# Patient Record
Sex: Female | Born: 1980 | Race: Black or African American | Hispanic: No | Marital: Single | Smoking: Never smoker
Health system: Southern US, Community
[De-identification: ages and names within clinical notes are randomized; demographics above are authoritative.]

## PROBLEM LIST (undated history)

## (undated) ENCOUNTER — Inpatient Hospital Stay (HOSPITAL_COMMUNITY): Payer: Self-pay

## (undated) DIAGNOSIS — O24419 Gestational diabetes mellitus in pregnancy, unspecified control: Secondary | ICD-10-CM

## (undated) DIAGNOSIS — R112 Nausea with vomiting, unspecified: Secondary | ICD-10-CM

## (undated) DIAGNOSIS — N883 Incompetence of cervix uteri: Secondary | ICD-10-CM

## (undated) DIAGNOSIS — Z6841 Body Mass Index (BMI) 40.0 and over, adult: Secondary | ICD-10-CM

## (undated) DIAGNOSIS — I10 Essential (primary) hypertension: Secondary | ICD-10-CM

## (undated) DIAGNOSIS — Z9889 Other specified postprocedural states: Secondary | ICD-10-CM

## (undated) DIAGNOSIS — Z8632 Personal history of gestational diabetes: Secondary | ICD-10-CM

## (undated) HISTORY — DX: Essential (primary) hypertension: I10

## (undated) HISTORY — DX: Morbid (severe) obesity due to excess calories: E66.01

## (undated) HISTORY — DX: Incompetence of cervix uteri: N88.3

## (undated) HISTORY — DX: Body Mass Index (BMI) 40.0 and over, adult: Z684

## (undated) HISTORY — PX: WISDOM TOOTH EXTRACTION: SHX21

## (undated) HISTORY — DX: Personal history of gestational diabetes: Z86.32

## (undated) HISTORY — PX: DILATION AND CURETTAGE OF UTERUS: SHX78

## (undated) HISTORY — DX: Gestational diabetes mellitus in pregnancy, unspecified control: O24.419

---

## 2003-12-30 ENCOUNTER — Inpatient Hospital Stay (HOSPITAL_COMMUNITY): Admission: AD | Admit: 2003-12-30 | Discharge: 2003-12-30 | Payer: Self-pay | Admitting: Obstetrics & Gynecology

## 2004-02-01 ENCOUNTER — Inpatient Hospital Stay (HOSPITAL_COMMUNITY): Admission: AD | Admit: 2004-02-01 | Discharge: 2004-02-02 | Payer: Self-pay | Admitting: Obstetrics and Gynecology

## 2004-03-22 ENCOUNTER — Inpatient Hospital Stay (HOSPITAL_COMMUNITY): Admission: AD | Admit: 2004-03-22 | Discharge: 2004-03-31 | Payer: Self-pay | Admitting: *Deleted

## 2004-03-22 IMAGING — US US OB COMP +14 WK
1 series · 13 of 28 positions shown · non-contrast
Comparison: none

CLINICAL DATA: Evaluate anatomy.

[Series 1: unknown · 0.35mm/px · 13 of 88 slices shown]
[im 4/88]
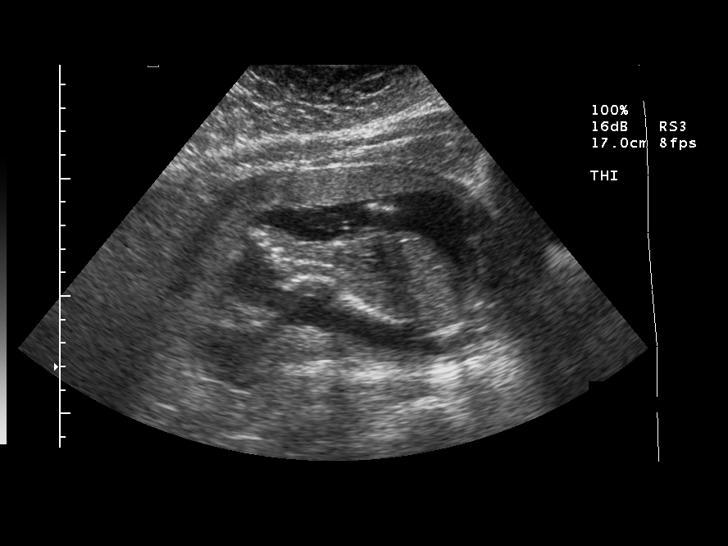
[im 10/88]
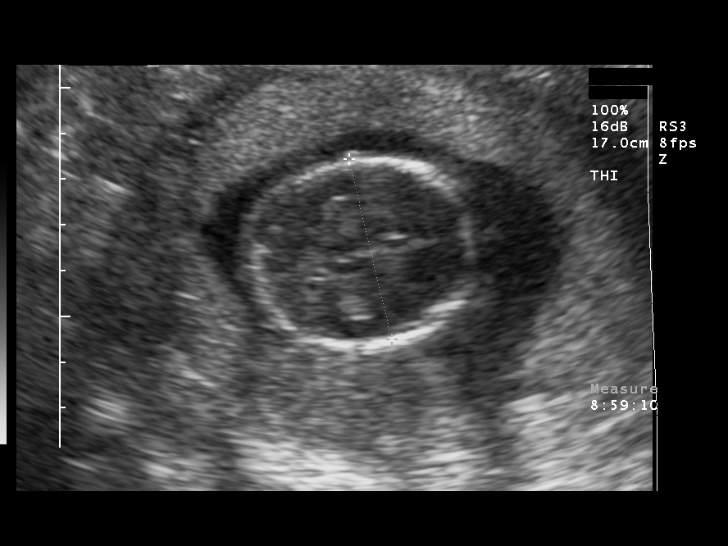
[im 17/88]
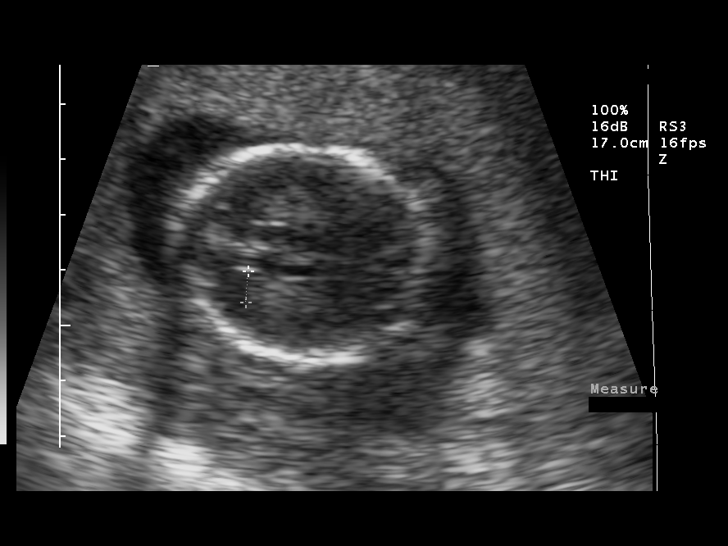
[im 23/88]
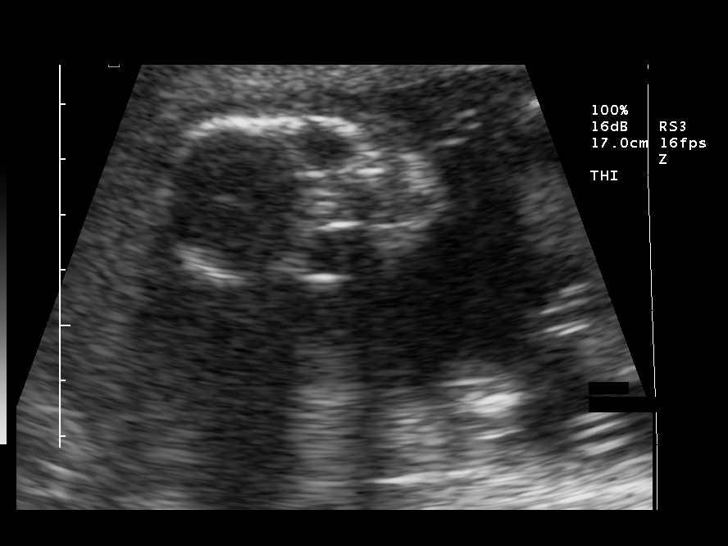
[im 30/88]
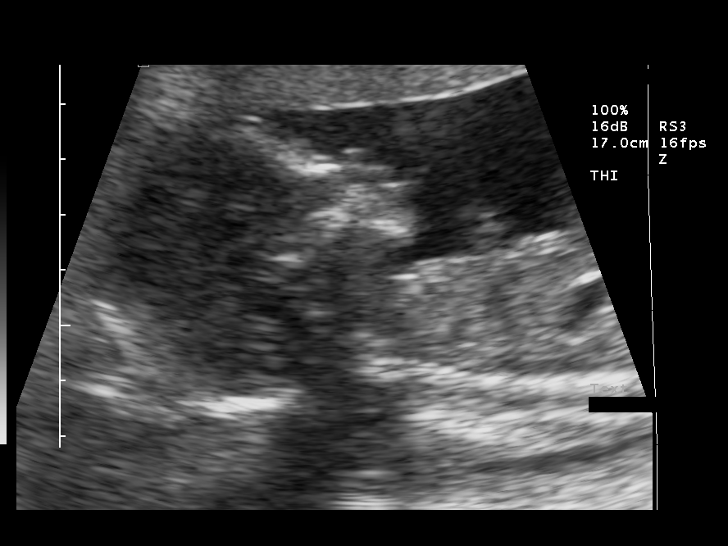
[im 36/88]
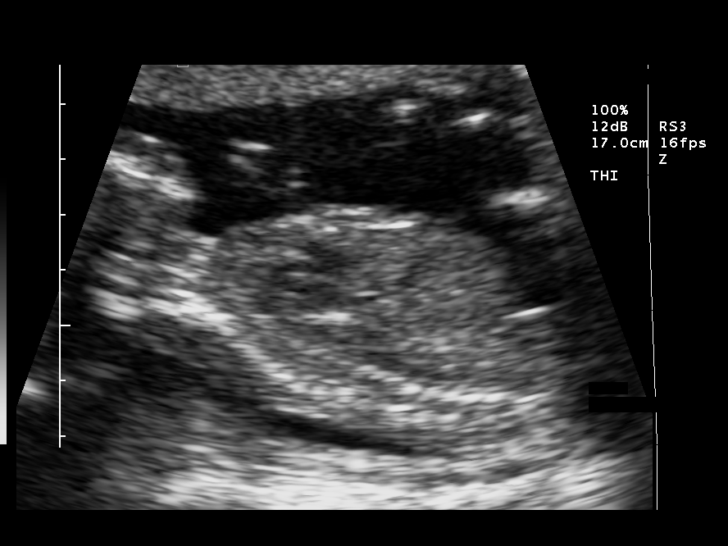
[im 46/88]
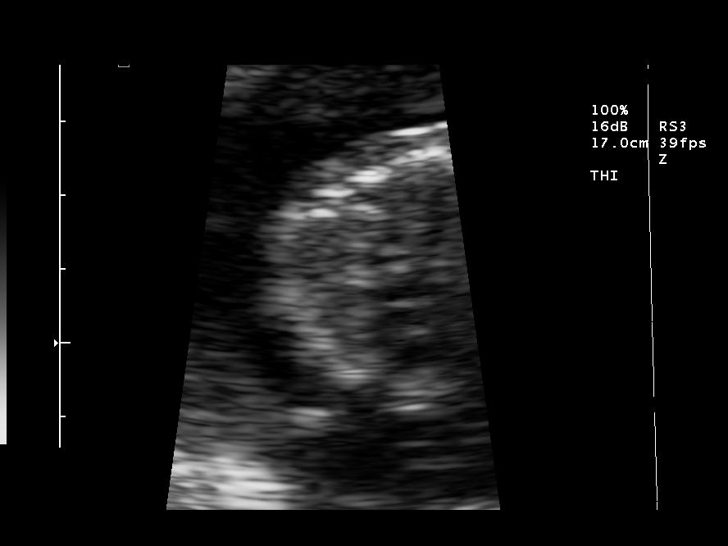
[im 52/88]
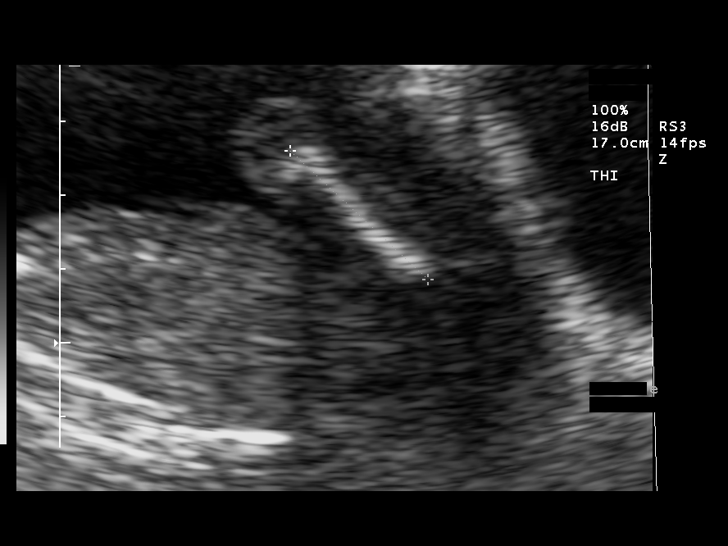
[im 59/88]
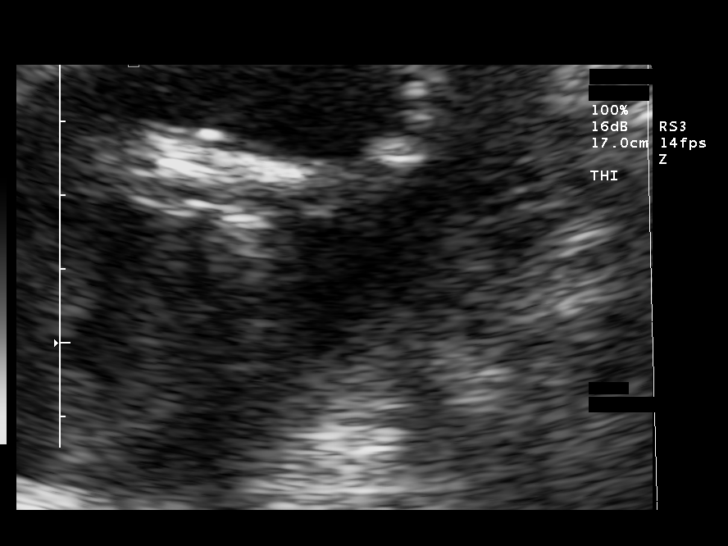
[im 65/88]
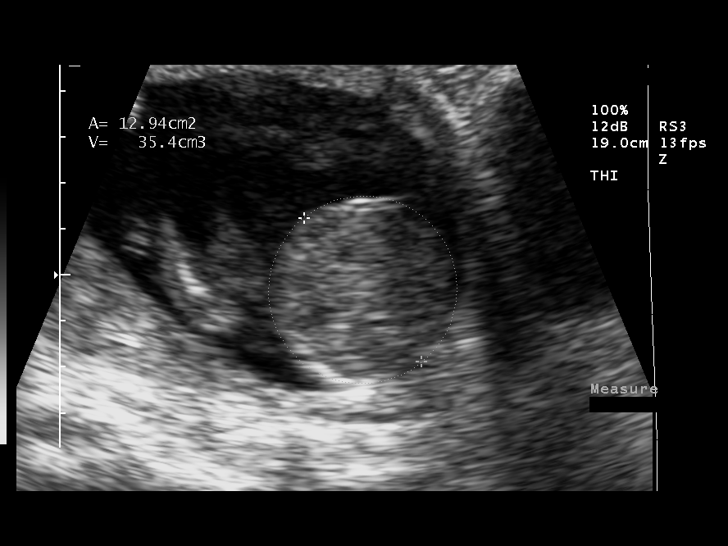
[im 71/88]
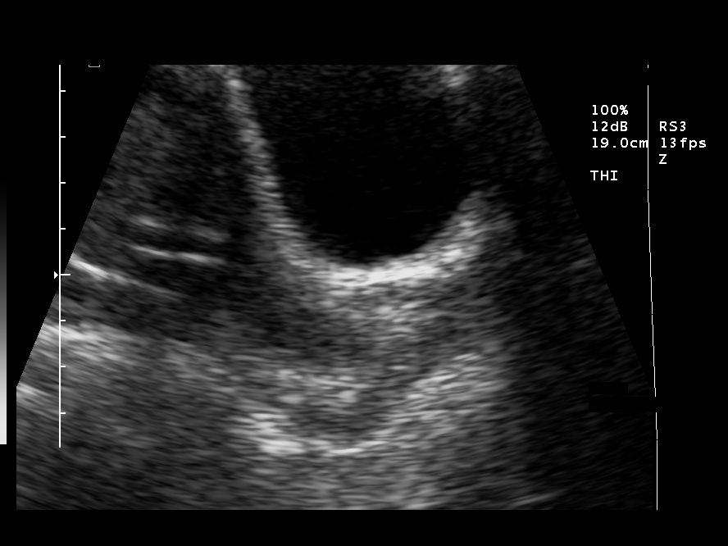
[im 78/88]
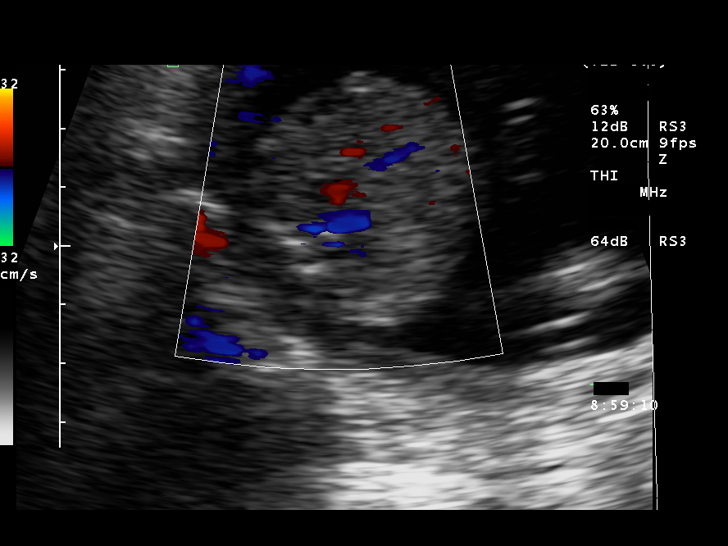
[im 84/88]
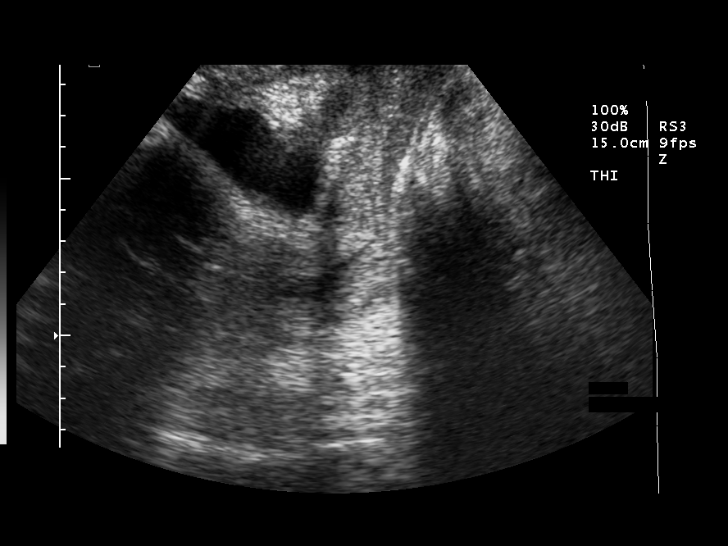

[13 of 28 positions shown; findings below may reference images not displayed]

OBSTETRICAL ULTRASOUND WITH TRANSVAGINAL:
 Number of Fetuses: 1
 Heart Rate:  168
 Movement:  Yes
 Breathing:  No  
 Presentation:  Breech
 Placental Location:  Anterior
 Grade:  I
 Previa:  No
 Amniotic Fluid (Subjective):  Normal
 Amniotic Fluid (Objective):   4.2 cm Vertical pocket 

 FETAL BIOMETRY
 BPD:   4.0 cm   18 w 1 d
 HC:   15.2 cm   18 w 2 d
 AC:   13.0 cm   18 w 4 d
 FL:    3.0 cm   19 w 1 d

 MEAN GA:  18 w 4 d

 FETAL ANATOMY
 Lateral Ventricles:    Visualized 
 Thalami/CSP:      Visualized 
 Posterior Fossa:  Visualized 
 Nuchal Region:    Visualized 
 Spine:      Limited
 4 Chamber Heart on Left:      Not visualized   
 Stomach on Left:      Visualized 
 3 Vessel Cord:    Visualized 
 Cord Insertion site:    Visualized 
 Kidneys:  Visualized 
 Bladder:  Visualized 
 Extremities:      Visualized 

 ADDITIONAL ANATOMY VISUALIZED:  RVOT, upper lip, orbits, profile, diaphragm, heel, 5th digit, ductal arch, aortic arch, and female genitalia

 Evaluation limited by:  Maternal habitus

 MATERNAL FINDINGS
 Cervix:   1.8 cm Transvaginally
IMPRESSION: Single living intrauterine fetus in breech presentation with subjectively normal amniotic fluid volume.  Mean gestational age today by ultrasound is 18 weeks 4 days which correlates well with the reported gestational age by LMP.  
 Visualized fetal anatomy is unremarkable although four chamber view of the heart and complete imaging of the spine was not obtained secondary to maternal habitus.  
 Dynamic fundus measuring as short as 1.8 cm transvaginally.  This report was called to [REDACTED] by the sonographer prior to the patient leaving the [HOSPITAL].

 </u12:p>

## 2004-04-03 ENCOUNTER — Encounter: Admission: RE | Admit: 2004-04-03 | Discharge: 2004-04-03 | Payer: Self-pay | Admitting: Obstetrics & Gynecology

## 2004-04-05 ENCOUNTER — Encounter: Admission: RE | Admit: 2004-04-05 | Discharge: 2004-04-05 | Payer: Self-pay | Admitting: *Deleted

## 2004-04-12 ENCOUNTER — Encounter: Admission: RE | Admit: 2004-04-12 | Discharge: 2004-04-12 | Payer: Self-pay | Admitting: *Deleted

## 2004-04-18 ENCOUNTER — Inpatient Hospital Stay (HOSPITAL_COMMUNITY): Admission: AD | Admit: 2004-04-18 | Discharge: 2004-04-22 | Payer: Self-pay | Admitting: *Deleted

## 2004-04-21 ENCOUNTER — Encounter (INDEPENDENT_AMBULATORY_CARE_PROVIDER_SITE_OTHER): Payer: Self-pay | Admitting: Specialist

## 2005-02-27 ENCOUNTER — Ambulatory Visit (HOSPITAL_COMMUNITY): Admission: RE | Admit: 2005-02-27 | Discharge: 2005-02-27 | Payer: Self-pay | Admitting: *Deleted

## 2005-03-14 ENCOUNTER — Ambulatory Visit: Payer: Self-pay | Admitting: *Deleted

## 2005-03-15 ENCOUNTER — Inpatient Hospital Stay (HOSPITAL_COMMUNITY): Admission: AD | Admit: 2005-03-15 | Discharge: 2005-03-17 | Payer: Self-pay | Admitting: Obstetrics and Gynecology

## 2005-03-15 ENCOUNTER — Ambulatory Visit: Payer: Self-pay | Admitting: Obstetrics and Gynecology

## 2005-03-21 ENCOUNTER — Ambulatory Visit: Payer: Self-pay | Admitting: Family Medicine

## 2005-03-22 ENCOUNTER — Ambulatory Visit: Payer: Self-pay | Admitting: *Deleted

## 2005-03-23 ENCOUNTER — Inpatient Hospital Stay (HOSPITAL_COMMUNITY): Admission: AD | Admit: 2005-03-23 | Discharge: 2005-03-23 | Payer: Self-pay | Admitting: Family Medicine

## 2005-04-03 ENCOUNTER — Ambulatory Visit: Payer: Self-pay | Admitting: *Deleted

## 2005-04-10 ENCOUNTER — Ambulatory Visit: Payer: Self-pay | Admitting: *Deleted

## 2005-04-24 ENCOUNTER — Ambulatory Visit: Payer: Self-pay | Admitting: Obstetrics & Gynecology

## 2005-05-01 ENCOUNTER — Ambulatory Visit (HOSPITAL_COMMUNITY): Admission: RE | Admit: 2005-05-01 | Discharge: 2005-05-01 | Payer: Self-pay | Admitting: *Deleted

## 2005-05-08 ENCOUNTER — Ambulatory Visit: Payer: Self-pay | Admitting: Obstetrics & Gynecology

## 2005-05-22 ENCOUNTER — Ambulatory Visit (HOSPITAL_COMMUNITY): Admission: RE | Admit: 2005-05-22 | Discharge: 2005-05-22 | Payer: Self-pay | Admitting: *Deleted

## 2005-05-22 ENCOUNTER — Ambulatory Visit: Payer: Self-pay | Admitting: *Deleted

## 2005-05-29 ENCOUNTER — Ambulatory Visit: Payer: Self-pay | Admitting: Obstetrics & Gynecology

## 2005-06-05 ENCOUNTER — Ambulatory Visit: Payer: Self-pay | Admitting: *Deleted

## 2005-06-12 ENCOUNTER — Ambulatory Visit: Payer: Self-pay | Admitting: Obstetrics & Gynecology

## 2005-06-19 ENCOUNTER — Ambulatory Visit: Payer: Self-pay | Admitting: Obstetrics & Gynecology

## 2005-06-26 ENCOUNTER — Ambulatory Visit: Payer: Self-pay | Admitting: Family Medicine

## 2005-07-03 ENCOUNTER — Ambulatory Visit: Payer: Self-pay | Admitting: Obstetrics & Gynecology

## 2005-07-10 ENCOUNTER — Ambulatory Visit: Payer: Self-pay | Admitting: Obstetrics & Gynecology

## 2005-07-17 ENCOUNTER — Ambulatory Visit: Payer: Self-pay | Admitting: *Deleted

## 2005-07-17 ENCOUNTER — Ambulatory Visit (HOSPITAL_COMMUNITY): Admission: RE | Admit: 2005-07-17 | Discharge: 2005-07-17 | Payer: Self-pay | Admitting: *Deleted

## 2005-07-24 ENCOUNTER — Inpatient Hospital Stay (HOSPITAL_COMMUNITY): Admission: AD | Admit: 2005-07-24 | Discharge: 2005-07-25 | Payer: Self-pay | Admitting: Obstetrics and Gynecology

## 2005-07-24 ENCOUNTER — Ambulatory Visit: Payer: Self-pay | Admitting: *Deleted

## 2005-07-24 ENCOUNTER — Ambulatory Visit: Payer: Self-pay | Admitting: Obstetrics and Gynecology

## 2005-07-29 ENCOUNTER — Ambulatory Visit: Payer: Self-pay | Admitting: *Deleted

## 2005-07-29 ENCOUNTER — Ambulatory Visit: Payer: Self-pay | Admitting: Obstetrics and Gynecology

## 2005-07-29 ENCOUNTER — Inpatient Hospital Stay (HOSPITAL_COMMUNITY): Admission: RE | Admit: 2005-07-29 | Discharge: 2005-07-29 | Payer: Self-pay | Admitting: *Deleted

## 2005-07-31 ENCOUNTER — Ambulatory Visit: Payer: Self-pay | Admitting: Obstetrics & Gynecology

## 2005-08-05 ENCOUNTER — Ambulatory Visit (HOSPITAL_COMMUNITY): Admission: RE | Admit: 2005-08-05 | Discharge: 2005-08-05 | Payer: Self-pay | Admitting: *Deleted

## 2005-08-05 ENCOUNTER — Ambulatory Visit: Payer: Self-pay | Admitting: Obstetrics and Gynecology

## 2005-08-07 ENCOUNTER — Ambulatory Visit: Payer: Self-pay | Admitting: Family Medicine

## 2005-08-09 ENCOUNTER — Inpatient Hospital Stay (HOSPITAL_COMMUNITY): Admission: AD | Admit: 2005-08-09 | Discharge: 2005-08-22 | Payer: Self-pay | Admitting: *Deleted

## 2005-08-09 ENCOUNTER — Ambulatory Visit: Payer: Self-pay | Admitting: *Deleted

## 2005-08-09 ENCOUNTER — Ambulatory Visit: Payer: Self-pay | Admitting: Family Medicine

## 2005-08-11 ENCOUNTER — Ambulatory Visit: Payer: Self-pay | Admitting: Neonatology

## 2005-08-19 ENCOUNTER — Encounter (INDEPENDENT_AMBULATORY_CARE_PROVIDER_SITE_OTHER): Payer: Self-pay | Admitting: Specialist

## 2005-08-24 ENCOUNTER — Inpatient Hospital Stay (HOSPITAL_COMMUNITY): Admission: AD | Admit: 2005-08-24 | Discharge: 2005-08-24 | Payer: Self-pay | Admitting: Obstetrics & Gynecology

## 2005-09-05 ENCOUNTER — Ambulatory Visit: Payer: Self-pay | Admitting: Family Medicine

## 2005-10-04 ENCOUNTER — Ambulatory Visit: Payer: Self-pay | Admitting: Family Medicine

## 2006-03-14 ENCOUNTER — Ambulatory Visit: Payer: Self-pay | Admitting: Family Medicine

## 2006-04-11 ENCOUNTER — Ambulatory Visit: Payer: Self-pay | Admitting: Family Medicine

## 2006-06-27 ENCOUNTER — Ambulatory Visit: Payer: Self-pay | Admitting: Family Medicine

## 2006-11-27 DIAGNOSIS — L659 Nonscarring hair loss, unspecified: Secondary | ICD-10-CM | POA: Insufficient documentation

## 2006-11-27 DIAGNOSIS — I10 Essential (primary) hypertension: Secondary | ICD-10-CM

## 2006-11-27 DIAGNOSIS — E669 Obesity, unspecified: Secondary | ICD-10-CM

## 2007-04-23 ENCOUNTER — Ambulatory Visit: Payer: Self-pay | Admitting: Family Medicine

## 2007-04-24 ENCOUNTER — Ambulatory Visit (HOSPITAL_COMMUNITY): Admission: RE | Admit: 2007-04-24 | Discharge: 2007-04-24 | Payer: Self-pay | Admitting: Obstetrics and Gynecology

## 2007-04-29 ENCOUNTER — Ambulatory Visit: Payer: Self-pay | Admitting: Gynecology

## 2007-05-07 ENCOUNTER — Ambulatory Visit: Payer: Self-pay | Admitting: Family Medicine

## 2007-05-28 ENCOUNTER — Ambulatory Visit: Payer: Self-pay | Admitting: *Deleted

## 2007-06-02 ENCOUNTER — Ambulatory Visit (HOSPITAL_COMMUNITY): Admission: RE | Admit: 2007-06-02 | Discharge: 2007-06-02 | Payer: Self-pay | Admitting: Family Medicine

## 2007-06-03 ENCOUNTER — Ambulatory Visit (HOSPITAL_COMMUNITY): Admission: RE | Admit: 2007-06-03 | Discharge: 2007-06-03 | Payer: Self-pay | Admitting: Family Medicine

## 2007-06-03 ENCOUNTER — Ambulatory Visit: Payer: Self-pay | Admitting: Family Medicine

## 2007-06-11 ENCOUNTER — Ambulatory Visit: Payer: Self-pay | Admitting: Family Medicine

## 2007-06-25 ENCOUNTER — Ambulatory Visit: Payer: Self-pay | Admitting: Obstetrics & Gynecology

## 2007-07-14 ENCOUNTER — Ambulatory Visit (HOSPITAL_COMMUNITY): Admission: RE | Admit: 2007-07-14 | Discharge: 2007-07-14 | Payer: Self-pay | Admitting: Obstetrics & Gynecology

## 2007-07-16 ENCOUNTER — Ambulatory Visit: Payer: Self-pay | Admitting: *Deleted

## 2007-07-28 ENCOUNTER — Ambulatory Visit (HOSPITAL_COMMUNITY): Admission: RE | Admit: 2007-07-28 | Discharge: 2007-07-28 | Payer: Self-pay | Admitting: Obstetrics & Gynecology

## 2007-08-06 ENCOUNTER — Ambulatory Visit: Payer: Self-pay | Admitting: Obstetrics & Gynecology

## 2007-08-11 ENCOUNTER — Ambulatory Visit (HOSPITAL_COMMUNITY): Admission: RE | Admit: 2007-08-11 | Discharge: 2007-08-11 | Payer: Self-pay | Admitting: Obstetrics & Gynecology

## 2007-08-13 ENCOUNTER — Ambulatory Visit: Payer: Self-pay | Admitting: Obstetrics & Gynecology

## 2007-08-24 ENCOUNTER — Ambulatory Visit: Payer: Self-pay | Admitting: *Deleted

## 2007-08-24 ENCOUNTER — Ambulatory Visit: Payer: Self-pay | Admitting: Obstetrics & Gynecology

## 2007-08-24 ENCOUNTER — Inpatient Hospital Stay (HOSPITAL_COMMUNITY): Admission: AD | Admit: 2007-08-24 | Discharge: 2007-08-26 | Payer: Self-pay | Admitting: Gynecology

## 2007-08-24 ENCOUNTER — Encounter: Payer: Self-pay | Admitting: *Deleted

## 2007-08-31 ENCOUNTER — Ambulatory Visit: Payer: Self-pay | Admitting: Obstetrics & Gynecology

## 2007-09-08 ENCOUNTER — Ambulatory Visit (HOSPITAL_COMMUNITY): Admission: RE | Admit: 2007-09-08 | Discharge: 2007-09-08 | Payer: Self-pay | Admitting: Obstetrics & Gynecology

## 2007-09-14 ENCOUNTER — Encounter: Admission: RE | Admit: 2007-09-14 | Discharge: 2007-09-14 | Payer: Self-pay | Admitting: Obstetrics & Gynecology

## 2007-09-14 ENCOUNTER — Ambulatory Visit: Payer: Self-pay | Admitting: Obstetrics & Gynecology

## 2007-09-22 ENCOUNTER — Ambulatory Visit (HOSPITAL_COMMUNITY): Admission: RE | Admit: 2007-09-22 | Discharge: 2007-09-22 | Payer: Self-pay | Admitting: Obstetrics & Gynecology

## 2007-09-28 ENCOUNTER — Ambulatory Visit: Payer: Self-pay | Admitting: Obstetrics & Gynecology

## 2007-10-08 ENCOUNTER — Inpatient Hospital Stay (HOSPITAL_COMMUNITY): Admission: AD | Admit: 2007-10-08 | Discharge: 2007-10-08 | Payer: Self-pay | Admitting: Family Medicine

## 2007-10-08 ENCOUNTER — Ambulatory Visit: Payer: Self-pay | Admitting: Physician Assistant

## 2007-10-08 ENCOUNTER — Encounter: Payer: Self-pay | Admitting: *Deleted

## 2007-10-12 ENCOUNTER — Ambulatory Visit: Payer: Self-pay | Admitting: Family Medicine

## 2007-10-12 ENCOUNTER — Ambulatory Visit: Payer: Self-pay | Admitting: Obstetrics and Gynecology

## 2007-10-12 ENCOUNTER — Inpatient Hospital Stay (HOSPITAL_COMMUNITY): Admission: AD | Admit: 2007-10-12 | Discharge: 2007-10-14 | Payer: Self-pay | Admitting: Obstetrics & Gynecology

## 2007-10-16 ENCOUNTER — Ambulatory Visit: Payer: Self-pay | Admitting: Obstetrics and Gynecology

## 2007-10-19 ENCOUNTER — Ambulatory Visit: Payer: Self-pay | Admitting: Family Medicine

## 2007-10-22 ENCOUNTER — Encounter: Payer: Self-pay | Admitting: *Deleted

## 2007-10-22 ENCOUNTER — Inpatient Hospital Stay (HOSPITAL_COMMUNITY): Admission: AD | Admit: 2007-10-22 | Discharge: 2007-10-26 | Payer: Self-pay | Admitting: Obstetrics & Gynecology

## 2007-10-22 ENCOUNTER — Ambulatory Visit: Payer: Self-pay | Admitting: Obstetrics and Gynecology

## 2007-10-23 ENCOUNTER — Encounter: Payer: Self-pay | Admitting: *Deleted

## 2007-10-24 ENCOUNTER — Encounter: Payer: Self-pay | Admitting: Obstetrics and Gynecology

## 2007-11-02 ENCOUNTER — Ambulatory Visit: Payer: Self-pay | Admitting: Gynecology

## 2007-11-04 ENCOUNTER — Ambulatory Visit: Payer: Self-pay | Admitting: Obstetrics and Gynecology

## 2007-12-09 ENCOUNTER — Ambulatory Visit: Payer: Self-pay | Admitting: Obstetrics & Gynecology

## 2007-12-09 ENCOUNTER — Encounter (INDEPENDENT_AMBULATORY_CARE_PROVIDER_SITE_OTHER): Payer: Self-pay | Admitting: Gynecology

## 2007-12-22 ENCOUNTER — Ambulatory Visit: Payer: Self-pay | Admitting: Family Medicine

## 2007-12-31 ENCOUNTER — Ambulatory Visit: Payer: Self-pay | Admitting: Obstetrics & Gynecology

## 2010-10-20 ENCOUNTER — Encounter: Payer: Self-pay | Admitting: *Deleted

## 2010-10-21 ENCOUNTER — Encounter: Payer: Self-pay | Admitting: *Deleted

## 2011-02-12 NOTE — Discharge Summary (Signed)
NAMEAURIANNA, EARLYWINE                 ACCOUNT NO.:  [id number]   MEDICAL RECORD NO.:  [id number]          PATIENT TYPE:  INP   LOCATION:                                 FACILITY:   PHYSICIAN:  Phil D. Okey Dupre, M.D.     DATE OF BIRTH:  [birth year]   DATE OF ADMISSION:  08/24/2007  DATE OF DISCHARGE:  08/26/2007                               DISCHARGE SUMMARY   ADMISSION DIAGNOSES:  1. Intrauterine pregnancy at 24 weeks and three days.  2. Elevated blood pressures.  3. History of pre-eclampsia and hemolysis, elevated liver enzymes and      low platelet count in previous pregnancy.  4. Morbid obesity.  5. History of gestational diabetes.  6. History of pre-term delivery, status post cerclage on June 03, 2007.   DISCHARGE DIAGNOSES:  1. Intrauterine pregnancy at 24 weeks and five days.  2. Gestational hypertension.  3. Gestational diabetes A-2.  4. History of pre-eclampsia and hemolysis, elevated liver enzymes and      low platelet count in previous pregnancy.  5. History of pre-term delivery, status post cerclage.  6. Morbid obesity   PROCEDURE:  The patient had an ultrasound for growth and biophysical  profile performed on August 24, 2007, showing a single gestation  cephalic presentation with an anterior placenta above the cervical os.  Amniotic fluid index was 11.5.  Biophysical profile was 6/8 with fetal  breathing not observed.  The estimated fetal weight was 617 grams, in  the 35th percentile.  Dating was consistent with previous ultrasound.  The cervical length was 4.56 cm without funneling noted.  Recommendation  for followup: Will check for growth and cervical length in two weeks.   CONSULTATIONS:  Maternal fetal medicine, Dr. Ashley Royalty.   COMPLICATIONS:  None.   PERTINENT LABORATORY FINDINGS:  She had admission pH labs showing a  white blood cell count of 8.6, hemoglobin 10.6, hematocrit 30.5,  platelets 395.  Sodium 136, potassium 3.4, BUN 2, creatinine  0.48,  glucose 112.  AST 18, ALT 15.  The remainder of the complete metabolic  profile was normal.  LDH 79, uric acid 3.9.  She had a 24-hour urine  performed that showed protein of 105, creatinine 14.41, creatinine  clearance of 2.8, volume of 2100.  She did have fasting CBG on day three  of her hospital admission of 108.   BRIEF PERTINENT ADMISSION HISTORY:  This is a 30 year old gravida 5,  para 0, 2, 2, 1, with a history of pre-term deliveries at 60 and 36  weeks.  She has a history of HELLP and pre-eclampsia in her 36-week  gestation.  She also has a history of gestational diabetes with a normal  one-hour glucose challenge test this pregnancy.  She was seen in the  [facility] on the morning of admission and had elevated blood  pressures in the 150's/90's.  She was admitted for evaluation for pre-  eclampsia.   HOSPITAL COURSE:  The patient was admitted.  PIH labs were drawn with  values as stated  above, within normal limits.  She had an ultrasound  showing estimated fetal weight in the 35th percentile and a BPP of 6/8.  The remainder of the results of the ultrasound are stated above.  Her  blood pressure ranged from 130's to 160's systolic over 60's to 90's  diastolic.  Maternal fetal medicine was consulted and the recommendation  was made to start antihypertensive, as she continued to have elevated  blood pressures.  Labetalol was started on hospital day number two at  200 mg p.o. twice daily, with good response.  Blood pressures in the  130's over 70's.  She had 24-hour urine performed, as stated above, with  105 mg of protein.  The patient remained asymptomatic throughout her  stay without headache, vision changes or abdominal pain.  She did not  have contractions.  She did have NSTs twice daily, showing reactivity.  She did have a couple of decelerations while on NST that quickly  returned to baseline.  She otherwise is stable.  She did have one  fasting blood glucose of  108 and it was determined that she should be  started on 2.5 mg of Glyburide at bedtime.   Nutrition did see the patient prior to discharge.  The patient was in  stable condition at the time of discharge and was ready to go home.   DISCHARGE STATUS:  Stable.   DISCHARGE MEDICATIONS:  1. Continue prenatal vitamins as prescribed.  2. Labetalol 200 mg p.o. twice daily.  3. Glyburide 2.5 mg p.o. q.h.s.   DISCHARGE INSTRUCTIONS:  1. Discharge to home.  2. A 2000 calorie diabetic diet as instructed.  3. Regular activity.  4. She is not to have sex or anything in her vagina.   FOLLOWUP:  She is to follow up at the [facility] on Monday,  August 31, 2007, at 9:15 a.m.      Karlton Lemon, MD  Electronically Signed     ______________________________  Javier Glazier. Okey Dupre, M.D.    NS/MEDQ  D:  08/26/2007  T:  08/26/2007  Job:  [id number]

## 2011-02-12 NOTE — Op Note (Signed)
[name], Miranda Arroyo                 ACCOUNT NO.:  [id number]   MEDICAL RECORD NO.:  [id number]          PATIENT TYPE:  AMB   LOCATION:                                [facility]   PHYSICIAN:  Tanya S. Shawnie Pons, M.D.   DATE OF BIRTH:  [birth year]   DATE OF PROCEDURE:  06/03/2007  DATE OF DISCHARGE:  06/03/2007                               OPERATIVE REPORT   PREOPERATIVE DIAGNOSIS:  Incompetent cervix.   POSTOPERATIVE DIAGNOSIS:  Incompetent cervix.   PROCEDURE:  Cervical Cerclage.   SURGEON:  Shelbie Proctor. Shawnie Pons, M.D.   ASSISTANT:  None.   ANESTHESIA:  Spinal anesthetic, Dr. Jean Rosenthal.   FINDING:  A closed cervix.   SPECIMENS:  None.   ESTIMATED BLOOD LOSS:  Minimal.   COMPLICATIONS:  None apparent.   REASON FOR PROCEDURE:  Briefly, the patient is a 30 year old gravida 5,  para 1-1-2-1, who has a history of a 22-week lost with a rescue  Cerclage, followed by a 36-week delivery after a prophylactic Cerclage  with her last pregnancy.  The patient is approximately [redacted] weeks pregnant  now and needs a repeat cervical Cerclage.   DESCRIPTION OF PROCEDURE:  The patient is taken to the operating room  where a spinal analgesia is administered.  She has been prepped and  draped in the usual sterile fashion.  Her bladder is emptied with a  catheter.  A weighted speculum is then placed inside the vagina.  The  cervix is visualized.  Scar from her old Cerclage is easily seen.  Following those marks approximately 2 cm up on the cervix a Mersilene  band is used to provide a cervical Cerclage.  This is started at 12  o'clock and exited at 9 o'clock, from 9 o'clock  to 6 o'clock, from 6  o'clock to  3 o'clock, from 3 o'clock to 12 o'clock, where the Cerclage is tied.  At  the end, the cervix permitted a fingertip only.  All instrument and  needle counts were correct x2.   The patient tolerated the procedure well and was taken to the recovery  room in stable condition.      Shelbie Proctor. Shawnie Pons,  M.D.  Electronically Signed     TSP/MEDQ  D:  06/03/2007  T:  06/03/2007  Job:  [id number]

## 2011-02-12 NOTE — Discharge Summary (Signed)
NAMESALLE, BRANDLE NO.:  [id number]   MEDICAL RECORD NO.:  [id number]          PATIENT TYPE:  INP   LOCATION:                                [facility]   PHYSICIAN:  Karlton Lemon, MD      DATE OF BIRTH:  [birth year]   DATE OF ADMISSION:  10/22/2007  DATE OF DISCHARGE:  10/26/2007                               DISCHARGE SUMMARY   INPATIENT DIAGNOSES:  1. Intrauterine pregnancy at 32 weeks, 5 days gestation.  2. Gestational hypertension.  3. Gestational diabetes  4. History of previous C-section.  5. Obesity.  6. History of incompetent cervix with cerclage placement at 13 weeks.   DISCHARGE DIAGNOSES:  1. Vaginal birth after cesarean at 33 weeks of viable infant female      weighing 3 pounds, 8 ounces with Apgars of 7 at 1 minute, 8 at 5      minutes.  2. Preeclampsia, severe.  3. Oligohydramnios.  4. Persistent postpartum hypertension.  5. Gestational diabetes.  6. History of incompetent cervix with cerclage placement at 13 weeks.   PROCEDURES:  1. The patient had an ultrasound performed on January 22 showing a      viable infant in cephalic presentation.  AFI was 7.59 with a      biophysical profile of 8/8.  Estimated fetal weight was 1694 grams      in the 25th percentile.  2. She had a repeat ultrasound  performed on October 23, 2007 with a      single gestation, cephalic presentation, AFI 6.69 cm.  Umbilical      artery Doppler showed no reverse over absent flow.   PERTINENT LABORATORY FINDINGS:  The patient had admission CBC:  White  blood cell count 9.1, hemoglobin 11.3, hematocrit 33.3, platelets of  301.  Urinalysis showed no protein and was otherwise normal.  Metabolic  panel showed sodium of 134, potassium 3.5, AST 20, ALT of 17, creatinine  0.72, uric acid 5.7, creatinine clearance on a 24-hour urine collection  was 138.  Urine volume on the 24-hour collection was 1875.  24-hour  protein was 394.  She had a group B Strep test that was  negative.  She  had repeat CBC:  Intrapartum hemoglobin 11.8 and hematocrit 35.2.  She  had follow up pH labs that remained within normal range with AST of 24,  ALT of 16, creatinine 0.76, LDH 142, uric acid of 6.1.   PERTINENT ADMISSION HISTORY:  Miranda Arroyo is a 30 year old, gravida 5,  para 0-2-2--1 presenting at 32 weeks, 5 days gestation with  hypertension.  She has noted gestational diabetes, a history of cervical  incompetence with cerclage placement during this pregnancy.  Her blood  pressure at the time of admission was 164/122 while on labetalol 200 mg  twice a day.  She was admitted for monitoring and evaluation.   HOSPITAL COURSE:  The patient was admitted, and her medications were  increased to labetalol titrated to a dose of 400 mg t.i.d.  She had a 24-  hour urine collection performed and  an ultrasound showed oligohydramnios  with an AFI of 7.5.  The biophysical profile was 8/8.  She did have  reactive NST.  Blood pressures remained somewhat elevated, But she did  respond somewhat to the increase in medications.  Her 24-hour did return  showing protein level of 394.  Her PIH labs were otherwise within normal  limits.  The patient was diagnosed with severe preeclampsia due to  severe elevated pressures, oligohydramnios, and elevated 24-hour urine  protein though the protein was not in a severe preeclamptic range.  The  patient did have a history of previous C-section but elected to have a  trial of labor after cesarean section.  Induction was started with  Pitocin low dose after her cerclage was removed.  The patient progressed  to delivery of a viable infant female on October 24, 2007 weighing 2  pounds, 8 ounces with Apgars of 7 at 1 minute, 8 at 5 minutes.  The  patient did well postpartum.  She was transferred to the antenatal  intensive care unit to continue on magnesium.  She had been started on  magnesium once it was determined she was a severe preeclamptic.  She   remained on magnesium 24 hours after delivery, and vitals were  monitored.  She did well in postpartum course, denied headache, right  upper quadrant pain, epigastric pain, or changes in her vision.  Blood  pressure at the time of discharge was 128/83 on hydrochlorothiazide 25  mg and labetalol 200 mg b.i.d.  Blood pressures ranged from 128 to 156  systolic over 76 to 104 diastolic.  The patient was otherwise in stable  condition with decreasing lochia.  Physical examination was normal, and  she was ready to be discharged in stable condition.  Capillary blood  glucose four hours postprandial was 75 on the date of discharge.   DISCHARGE STATUS:  Stable.   DISCHARGE MEDICATIONS:  1. Prenatal vitamins 1 tablet p.o. daily.  2. Motrin 600 mg 1 tablet by mouth every 6 hours as needed for pain.  3. Colace 100 mg 1 tab twice daily.  4. Ferrous sulfate 325 mg 1 tablet by mouth twice daily.  5. Labetalol 200 mg 1 tablet by mouth twice daily.  6. Micronor Oral contraceptive 1 tablet by mouth daily as directed.   DISCHARGE INSTRUCTIONS:  1. Discharge to home.  2. No sex times 6 weeks.  3. 2000-calorie diabetic diet.  4. The patient is to follow up in one week for blood pressure check at      high risk clinic.  5. The patient is to follow up at [facility] in      six weeks for postpartum check and placement of Mirena IUD.  The      patient is to use oral contraceptive pill until she gets that      method birth control.      Karlton Lemon, MD  Electronically Signed     NS/MEDQ  D:  10/26/2007  T:  10/26/2007  Job:  [phone]

## 2011-02-12 NOTE — Discharge Summary (Signed)
NAMECHARLINE, Arroyo                 ACCOUNT NO.:  [id number]   MEDICAL RECORD NO.:  [id number]          PATIENT TYPE:  INP   LOCATION:  9158                          FACILITY:  [facility]   PHYSICIAN:  Lesly Dukes, M.D. DATE OF BIRTH:  01-14-1981   DATE OF ADMISSION:  10/12/2007  DATE OF DISCHARGE:  10/14/2007                               DISCHARGE SUMMARY   At the time of her admission, patient is a 30 year old gravida 5, para 0-  2-2-1.  She was [redacted] weeks gestation with elevated blood pressures.  Patient has a history of hypertension outside of pregnancy and was  brought in for observation to rule out superimposed pre-eclampsia.  The  patient's pregnancy has been followed in the high-risk clinic and has  been complicated by A2 gestational diabetes, obesity and chronic  hypertension.  At the time of her admission, the patient's medication  list included Glyburide, labetalol and Bactrim.   During the course of her hospital stay, her blood pressures have  remained borderline, 140s/90s, some 150s/90s.  In the last 24 hours, her  blood pressures have consistently run 130s/70s.  Her PIH labs at the  time of her admission were normal and 24-hour urine was collected during  this hospital stay that had a total protein of 221 mg.  At the time of  her admission, her white blood count was 7, her hemoglobin was 11.8,  hematocrit 34.5 and platelet count was 364.  Her creatinine level was  0.73.  Liver enzymes were normal, SGOT at 33, SGPT 18.  Patient has been  on bed rest during this hospital stay and that does seem to have helped  her blood pressures.   She had a maternal fetal medicine consult, who recommended steroids for  fetal lung maturity and patient did receive a course of four  dexamethasone shots.  The last one was given on the evening of October 13, 2007.  Her blood sugars have been elevated since her admission,  secondary to her steroids, and the patient was started on insulin and  insulin has been adjusted accordingly throughout her hospital stay.   Patient is now being discharged on hospital day #3 with no complaints,  no signs or symptoms of pre-eclampsia.  Her blood pressures in the last  24 hours have been 130s/70s to 148/68, and she is afebrile.  The last 24  hours of blood sugars:  Her blood sugar this morning was 115, two-hours  postprandial yesterday were 128, 141, 158.  She is alert and oriented  times three.  Her exam is benign today.  Her uterus is gravid, but soft  and nontender.  Her extremities show trace edema.  She has normal  reflexes and no calf pain.  Fetal heart rate today is 140 and reactive  without decelerations.  Toco admits no uterine contractions.   ASSESSMENT AND PLAN TODAY:  1. She is a 30 year old gravida 5, para 0-2-2-1, at 31 weeks and 4      days with chronic hypertension the status of which has improved on      bed  rest, medications, and there is no current evidence of      superimposed pre-eclampsia.  2. A2 gestational diabetes.  Blood sugars have been elevated secondary      to a course of dexamethasone for fetal lung maturity and have      improved with titrations of her insulin.  Patient is being      discharged today with prescriptions for insulin NPH 50 units      q.a.m., 50 units q.h.s. subcu, regular 25 units q.a.m. and 18 units      q.a.c. dinner.  Her glyburide has also been increased to 5 mg p.o.      twice a day.  Patient is instructed to check her blood sugars four      times daily and bring those with her when she follows up.   Patient has a cerclage in place, which is currently stable.  She has no  signs or symptoms of preterm labor.  She is being discharged today.   FOLLOWUP:  Includes following up with the clinic for NST and evaluation  of blood sugars on Friday, January 16, at 11 a.m. and she also has an  appointment Monday, January 19, in high-risk clinic for an OB  appointment, NST and  BPP.  She is also  instructed to continue her bed rest at home and  continue her dose of labetalol, which is 400 mg twice a day.  Patient  was being treated for urinary tract infection prior to her admission in  the hospital and was instructed to complete her course of Bactrim, which  will be completed on Friday.      Maylon Cos, C.N.M.      Lesly Dukes, M.D.  Electronically Signed    SS/MEDQ  D:  10/14/2007  T:  10/14/2007  Job:  [id number]

## 2011-02-12 NOTE — Assessment & Plan Note (Signed)
NAMEPHILIP, ECKERSLEY NO.:  [id number]   MEDICAL RECORD NO.:  [id number]           PATIENT TYPE:   LOCATION:  [facility]           FACILITY:   PHYSICIAN:  Tinnie Gens, MD             DATE OF BIRTH:   DATE OF SERVICE:                                  CLINIC NOTE   CHIEF COMPLAINT:  High blood pressures.   HISTORY OF PRESENT ILLNESS:  The patient is a 30 year old lady who came  for a postpartum check approximately 2 weeks ago who had an elevated  blood pressure at that time. She also had an early delivery at 33 weeks  related to preeclampsia and HELLP.  It looks like she is going to have  elevated blood pressure all the time.  The patient was started on ACTZ  50 mg every day for blood pressure of 160/110 at her last visit. Today  she comes with in with pressures still elevated at 142/104. She also  complains that she has previously been tried on Labetalol which made her  dizzy and she has stopped this. She is also interested in Implanon for  birth control. Her PAP smear was normal 2 weeks ago.   IMPRESSION:  Continued elevated blood pressure.  The patient is going  for a doctor appointment in approximately 2 weeks. We will start her on  Lisinopril 20 mg, 1 p.o. daily prior to this to keep her blood pressure  under control.  Additionally, the patient is scheduled to return for  Implanon insertion with either myself or Dr. Penne Lash.  The patient  probably needs a 2 hour GTT to prove she no longer has problems with  diabetes.           ______________________________  Tinnie Gens, MD     TP/MEDQ  D:  12/22/2007  T:  12/22/2007  Job:  [id number]

## 2011-02-15 NOTE — Op Note (Signed)
Miranda Arroyo, Miranda Arroyo                             ACCOUNT NO.:  [id number]   MEDICAL RECORD NO.:  [id number]                   PATIENT TYPE:  INP   LOCATION:  9111                                 [facility]ACILITY:  [facility]   PHYSICIAN:  Tanya S. Shawnie Pons, M.D.                DATE OF BIRTH:  05/17/1981   DATE OF PROCEDURE:  03/26/2004  DATE OF DISCHARGE:                                 OPERATIVE REPORT   PREOPERATIVE DIAGNOSES:  1. Incompetent cervix.  2. Group B Streptococcus positive.   POSTOPERATIVE DIAGNOSES:  1. Incompetent cervix.  2. Group B Streptococcus positive.   PROCEDURE:  Rescue McDonald cerclage with a Mersilene band tied at 12  o'clock.   SURGEON:  Shelbie Proctor. Shawnie Pons, M.D.   ANESTHESIA:  Spinal, Burnett Corrente, M.D.   COMPLICATIONS:  None.   FINDINGS:  Cervix open at 2 cm.   REASON FOR PROCEDURE:  This patient is a 30 year old G3, P0-0-2-0, who is at  5 and 4 found to have dynamic changes on ultrasound and shortened cervix as  well as the cervix was dilated early.  The patient had consensus for a  rescue cerclage.   PROCEDURE:  The patient was taken to the OR, where she was placed in dorsal  lithotomy in Nickerson stirrups.  She was prepped and draped in the usual  sterile fashion, a Clindamycin douche was used preoperatively.  A McDonald  cerclage was started at 12 o'clock, carried to 9, 6, and 3, and then back to  12 and tied.  The cervix was somewhat friable, and a Clindamycin douche was  used after the procedure.  The suture was tied down at the 12 o'clock  position to admit one fingertip.  The patient tolerated the procedure well.  All instrument, needle, and lap counts were correct x2.  The patient was  taken to recovery in stable condition.                                               Shelbie Proctor. Shawnie Pons, M.D.    TSP/MEDQ  D:  03/26/2004  T:  03/26/2004  Job:  [id number]

## 2011-02-15 NOTE — Op Note (Signed)
NAMEANA, WOODROOF                 ACCOUNT NO.:  [id number]   MEDICAL RECORD NO.:  [id number]          PATIENT TYPE:  INP   LOCATION:  9313                          [facility]ACILITY:  [facility]   PHYSICIAN:  Phil D. Okey Dupre, M.D.     DATE OF BIRTH:  07-11-1981   DATE OF PROCEDURE:  03/16/2005  DATE OF DISCHARGE:                                 OPERATIVE REPORT   PREOPERATIVE DIAGNOSIS:  Weak cervix.   POSTOPERATIVE DIAGNOSIS:  Weak cervix.   SURGEON:  Dr. Okey Dupre   PROCEDURE:  Cervical cerclage.   ANESTHESIA:  Spinal.   ESTIMATED BLOOD LOSS:  20 mL.   POSTOPERATIVE CONDITION:  Satisfactory.   DESCRIPTION OF PROCEDURE:  Under satisfactory spinal anesthesia with the  patient in dorsolithotomy position, the perineum and vagina were prepped and  draped in the usual sterile manner.  Bimanual pelvic examination revealed a  uterus of about 12 weeks size, freely movable, with a long, closed cervix.  Weighted speculum was placed in the posterior fourchette of the vagina, and  the anterior lip of the cervix was grasped with a ring forceps.  Xylocaine  1%, 4 mL with 1:200,000 epinephrine was injected just in front of the  bladder superficially into the cervix.  A 1 x 2 cm transverse incision was  made just below the bladder which was pushed cephalad by blunt dissection so  that the distance from the tip of the cervix to the distal end of the cervix  to the bladder was approximately 4 cm and with a 5 mm Mersilene suture was  run from 12 o'clock to 9 o'clock, staying approximately 4 cm from the distal  end of the cervix.  This was repeated from 12 o'clock to 3 o'clock on the  other side of the cervix, then grasped from the posterior lip of the cervix  and pulling up.  Each of the sutures were put in at 9 and 3 o'clock and  brought out at 6 o'clock and tied approximately 3 cm from the distal end of  the cervix just below the cul-de-sac.  The Mersilene suture was then cut  short.  The bladder was resewn with  figure-of-eights over the anterior  surgical site for hemostasis with a 3-0 chromic catgut suture.  Total blood  loss at the end of the procedure was approximately 20 mL.  The patient  tolerated the procedure well was transferred to the recovery room in  satisfactory condition.       PDR/MEDQ  D:  03/16/2005  T:  03/16/2005  Job:  [id number]

## 2011-02-15 NOTE — Discharge Summary (Signed)
Miranda Arroyo, Miranda Arroyo                             ACCOUNT NO.:  [id number]   MEDICAL RECORD NO.:  [id number]                   PATIENT TYPE:  INP   LOCATION:  9303                                 [facility]  [facility]   PHYSICIAN:  Conni Elliot, M.D.             DATE OF BIRTH:  [birth year]   DATE OF ADMISSION:  04/18/2004  DATE OF DISCHARGE:  04/22/2004                                 DISCHARGE SUMMARY   ADMISSION DIAGNOSES:  59. A 30 year old gravida 3 para 0-0-2-0 at 50 and 2 with active labor.  2. History of cervical incompetence with cerclage placed prior to admission.  3. Two previous abortions.   DISCHARGE DIAGNOSES:  65. A 30 year old gravida 3 para 0-0-3-0 postpartum day #1 from a vaginal     delivery of a nonviable 22-week girl.  2. Appropriate grieving.   DISCHARGE MEDICATIONS:  1. Multivitamin one p.o. daily.  2. Ibuprofen 400 mg p.o. q.6h. p.r.n. pain.   ADMISSION HISTORY:  Miranda Arroyo was admitted at 22 and 2 weeks with active  contractions.  On exam her cervix was noted to 2, 100, with a bulging bag  through the lower uterine segment.  She was admitted and placed on  magnesium, terbutaline, and Unasyn.   HOSPITAL COURSE:  PRETERM LABOR.  Miranda Arroyo was admitted to antenatal.  Her  contractions did slow on the above regimen.  She was also given Motrin with  that regimen to help with tocolysis.  She had been doing well for her first  3 days of her hospitalization.  On hospital day #3 she began having  increased lower abdomen pain with some vaginal pressure.  On exam she was  noted to be at +3-4 station with a tight bulging bag of membranes as well as  the fetal head through the cervix.  Due to the fact that she had a cerclage  to risk the chance of serious bleeding the membranes were ruptured in order  to remove the cerclage.  Once the cerclage was removed the infant delivered  quickly.  On delivery it was noted to have a heart rate of 80 for  approximately 9 minutes.   POSTPARTUM COURSE:  Miranda Arroyo began grieving appropriately immediately  postpartum.  She was given information for grief support and counseling.   Of note, the patient the infant's delivery Miranda Arroyo did have some elevated  blood sugars to a fasting of 108.  She was started on glyburide twice a day.  She will need to be followed up 6 weeks postpartum for a glucose test.   CONDITION ON DISCHARGE:  Miranda Arroyo was discharged in medically stable  condition.   INSTRUCTIONS GIVEN TO PATIENT:  The patient was told of the above medical  regimen and to follow up with her primary care physician.    Jon Gills, M.D.  Conni Elliot, M.D.   LC/MEDQ  D:  04/22/2004  T:  04/22/2004  Job:  [id number]

## 2011-02-15 NOTE — Discharge Summary (Signed)
NAMETRIVA, HUEBER NO.:  [id number]   MEDICAL RECORD NO.:  [id number]                   PATIENT TYPE:  INP   LOCATION:  9111                                 [facility]ILITY:  [facility]   PHYSICIAN:  Lesly Dukes, M.D.              DATE OF BIRTH:  Jun 18, 1981   DATE OF ADMISSION:  03/22/2004  DATE OF DISCHARGE:  03/31/2004                                 DISCHARGE SUMMARY   HISTORY OF PRESENT ILLNESS:  The patient presented to the hospital on March 22, 2004 for her regular routine ultrasound for her 18 weeks and 2 days  estimated gestational age. She was found to have a dynamic cervix down to  1.7 cm. Apart from the history taken, the patient had had a loss of  somewhere around 16 weeks with __________ and then miscarriage at the  hospital. At this point in time, patient was given the diagnosis of cervical  weakness. She was placed on Indocin and Unasyn and prepared for cerclage. A  rescue cerclage was placed on March 26, 2004 when the patient was noted to  already be 2 cm dilated at the time of the cerclage placement. After the  cerclage, she was noted to have at least 2 cm length and the McDonald  cerclage was noted to have no tension on the stitch. The cerclage was tied  at 12:00 o'clock. Since the cerclage, the patient has been stable. She has  not had any leakage of fluid, vaginal bleeding or contractions. During her  stay at the hospital, she was diagnosed with diabetes where she was placed  on diet and given prescriptions for Glucometer strips and Lancets. Since she  has started on the diet, she is in better control. Other test results of  interest are positive GBS. On discharge day March 31, 2004, the patient's  cervix was fingertip to 1 cm open. The cervix seemed long and the presenting  part was way out of the pelvis. There was tone to the cervix and there was  no tension on the stitch.   DISPOSITION:  The patient is going to be discharged home on  bedrest with  bathroom privileges.   FOLLOW UP:  At [facility].   DISCHARGE INSTRUCTIONS:  1. Bedrest with bathroom privileges.  2. Amoxicillin 875 mg p.o. b.i.d.  3. [facility] next week, where she will be instructed to start a     __________ douche that she will be alternating weekly with Amoxicillin.  4. Check fasting sugars each morning and 2 hours postprandial. To bring     meter and book to each visit.  5. Return to the emergency room with fever, abdominal pain, cramping, loss     of fluid, vaginal bleeding, or no fetal movement.  Lesly Dukes, M.D.    Lora Paula  D:  03/31/2004  T:  04/01/2004  Job:  [id number]

## 2011-02-15 NOTE — Discharge Summary (Signed)
Miranda Arroyo, WINDOM                 ACCOUNT NO.:  [id number]   MEDICAL RECORD NO.:  [id number]          PATIENT TYPE:  INP   LOCATION:  9319                          [facility]   PHYSICIAN:  Tracy L. Mayford Knife, M.D.DATE OF BIRTH:  08-24-1981   DATE OF ADMISSION:  08/09/2005  DATE OF DISCHARGE:  08/22/2005                                 DISCHARGE SUMMARY   DISCHARGE DIAGNOSES:  1.  Status post low transverse cesarean section on August 19, 2005.  2.  Status post cerclage removal on August 17, 2005.  3.  Gestational diabetes.  4.  Preeclampsia.  5.  Hemolysis, elevated liver enzymes, and low platelet count.  6.  Viral gastroenteritis.   DISCHARGE MEDICATIONS:  1.  Ibuprofen 600 mg q.6h. p.r.n. pain.  2.  Glyburide 2.5 mg twice a day.  3.  Hydrochlorothiazide 25 mg daily.  4.  Labetalol 200 mg twice a day.   HOSPITAL COURSE:  The patient is a 30 year old African American female,  gravida 4, para 0-1-2-0, who presented at 32-5/7 weeks with headache, and  scotoma. She was not having any contractions, any rupture of membranes, or  vaginal bleeding and was feeling the baby move.  Her scotoma started two  weeks ago prior to admission and she had not complained of headaches prior  to her pregnancy.  She was complaining of nausea but no vomiting and noted  swelling of her hands and feet the day prior to admission.  Her pregnancy  had been complicated by hypertension, gestational diabetes and she did have  a cerclage in place because she had a history of a preterm infant who did  not survive birth.   MEDICATIONS:  1.  Prenatal vitamins.  2.  Glyburide 2.5 mg daily.  3.  Nifedipine XL 30 mg twice a day.   Following admission to the antenatal unit for preeclampsia and worsening  hypertension, she was placed on bed rest and given betamethasone x2.  Her  pressures continued to remain elevated and her 24-hour urine collection  continued to show increasing  proteinuria. On August 13, 2005, the patient  began complaining of severe right upper quadrant pain with nausea and  vomiting.  She was started on magnesium and stat PIH labs were drawn which  did not show any elevated of her liver enzymes nor decreased platelets.  It  was thought that she had viral gastroenteritis as her right upper quadrant  ultrasound was within normal limits.  Her pain resolved and she again became  afebrile following her viral illness.  Her CBGs remained continually  elevated and she was started on insulin during her hospitalization.  On  August 17, 2005, her cerclage was removed in anticipation of labor and on  August 19, 2005, the patient was taken to cesarean section for worsening  preeclampsia, HELLP and nonreassuring fetal heart tones.  She delivered a  viable baby girl.  For more information, please see the op note dictation.   Postoperatively, the patient remained on magnesium, tolerated fluid and p.o.  intake well.  She did complain of dizziness when  sitting or standing which  was felt to be secondary to her magnesium.  Her urine output was excellent  and she adequately diuresed with a magnesium. She was started on labetalol  400 mg twice a day which was eventually weaned down to 200 mg twice a day  and started on hydrochlorothiazide 25 mg daily in order to better control  her blood pressures.  Her gestational diabetes postpartum was doing much  better and her insulin was no longer required.  She will be sent home on  glyburide 2.5 mg twice a day and follow up with her primary care physician.   FOLLOW UP:  1.  The patient will have her staples removed on Saturday, November 25, at      [facility].  2.  The patient will follow up with Dr. Dillard Essex at [facility] on September 05, 2005.  3.  Contraception:  The patient will begin using the NuvaRing at her six-      week appointment with Dr. Dillard Essex and was educated that in the interim      it is  possible for her to become pregnant.      Whitney Post, M.D.    ______________________________  Marc Morgans. Mayford Knife, M.D.    KF/MEDQ  D:  08/22/2005  T:  08/22/2005  Job:  [id number]

## 2011-02-15 NOTE — Discharge Summary (Signed)
[name], [name]                 ACCOUNT NO.:  [id number]   MEDICAL RECORD NO.:  [id number]          PATIENT TYPE:  WOC   LOCATION:  WOC                          FACILITY:  [facility]   PHYSICIAN:  Angie B. Merlene Morse, MD  DATE OF BIRTH:  [birth year]   DATE OF ADMISSION:  07/24/2005  DATE OF DISCHARGE:  07/25/2005                                 DISCHARGE SUMMARY   ADMITTING DIAGNOSES:  1.  Elevated blood pressure.  2.  Gestational diabetes.  3.  30-week and 3-day intrauterine pregnancy.   DISCHARGE DIAGNOSES:  1.  Elevated blood pressure.  2.  Gestational diabetes.  3.  30-week and 3-day intrauterine pregnancy.   ADMITTING HISTORY AND PHYSICAL:  [name] is a 30 year old G4, P0-1-2-0 who  presents at 30-3/7 weeks who was found to have elevated blood pressures in  the clinic.  Her blood pressure in the clinic was 148/104, 152/110, and  167/111.  Patient was sent to the MAU for further evaluation.  She denied  any symptoms of toxemia.   HOSPITAL COURSE:  Patient was admitted.  She remained on bed rest.  Her  blood pressures did stabilize.  They were in the 130s-140s systolic over 70s-  80s diastolic.  Patient did recently have a 24-hour urine protein that was  done in the clinic which was 165 mm of protein.  Patient also had  preeclamptic PIH laboratories on admission which were essentially normal.  They include AST of 40, ALT of 27, BUN and creatinine of less than 1 and  0.6, uric acid 4.5, LDH of 112.  Hemoglobin 11.6, platelets 378.  Patient  had a sterile vaginal examination on admission, was found to be closed.  She  also had a wet prep done which was negative.  Patient was found to have  elevated CBGs.  Her two-hour post prandial breakfast was in the 140s.  For  this reason her Glyburide was increased from 2.5 q.h.s. to 2.5 b.i.d.   DISCHARGE INSTRUCTIONS:  Patient was told to decrease her activity and try  to stay in bed as much as possible.  She was given preeclamptic  precautions.   DISCHARGE MEDICATIONS:  Glyburide 2.5 one p.o. b.i.d., #60 with two refills.   Patient was to follow up in the high risk clinic for an NST and BPP on  Monday at 10 a.m. and has an already scheduled appointment for Wednesday.   CONDITION ON DISCHARGE:  Stable.           ______________________________  August Saucer. Merlene Morse, MD    ABC/MEDQ  D:  07/25/2005  T:  07/25/2005  Job:  [id number]

## 2011-02-15 NOTE — Op Note (Signed)
Miranda Arroyo, Miranda Arroyo                 ACCOUNT NO.:  [id number]   MEDICAL RECORD NO.:  [id number]          PATIENT TYPE:  OUT   LOCATION:  ULT                           [facility]   PHYSICIAN:  Phil D. Okey Dupre, M.D.     DATE OF BIRTH:  1981-02-20   DATE OF PROCEDURE:  08/19/2005  DATE OF DISCHARGE:                                 OPERATIVE REPORT   PROCEDURE:  Low transverse cesarean section.   PREOPERATIVE DIAGNOSIS:  HELLP syndrome, nonreassuring fetal heart pattern  and cord prolapse.   POSTOPERATIVE DIAGNOSIS:  HELLP syndrome, nonreassuring fetal heart pattern  and cord prolapse.  Plus occult prolapsed umbilical cord.   SURGEON:  Dr. Okey Dupre   FIRST ASSISTANT:  Barth Kirks, M.D.   ESTIMATED BLOOD LOSS:  500 mL.   ANESTHESIA:  Epidural.   FINDINGS:  Female infant. No weight, Apgar, or cord pH available at this  dictation time.   DESCRIPTION OF PROCEDURE:  Under satisfactory epidural anesthesia with the  patient in dorsal supine position. A Foley catheter in urinary bladder. The  abdomen was prepped and draped in usual sterile manner and entered through a  transverse Pfannenstiel incision where the incision was just above the  abdominal pannus. Abdomen was entered by layers. On entering the peritoneal  cavity, visceral peritoneum and anterior surface of the uterus was opened  transversely by sharp dissection and the bladder pushed away the lower  uterine segment, was entered by sharp and blunt dissection and immediately  upon entering the uterine cavity, coils of the umbilical cord extruded from  the incision which was lysing right next to the baby's head which was in the  ROT presentation. The baby was easily delivered. Cord doubly clamped,  divided. The baby handed to pediatrician and samples of blood taken from the  cord for analysis. The placenta was spontaneously removed and sent to  pathology. The uterus explored and closed with continuous running locked 0  Vicryl on  an atraumatic needle with a second imbricating suture of the same  material areas observed for bleeding. None was noted and the fascia was  closed with a running locked 0 Vicryl on an atraumatic needle. Subcutaneous  approximation was carried out with 2-0 plain catgut suture and skin staples  for skin edge approximation. Dry sterile dressing was applied. The patient  transferred to recovery room in satisfactory condition. Tape, instrument,  sponge, needle count reported correct at the end of the procedure and the  patient was in satisfactory condition at the end the procedure with Foley  catheter draining clear amber urine.           ______________________________  Javier Glazier Okey Dupre, M.D.    PDR/MEDQ  D:  08/19/2005  T:  08/19/2005  Job:  [id number]

## 2011-06-19 LAB — URIC ACID: Uric Acid, Serum: 5

## 2011-06-19 LAB — COMPREHENSIVE METABOLIC PANEL
AST: 33
Albumin: 2.2 — ABNORMAL LOW
Albumin: 2.5 — ABNORMAL LOW
Alkaline Phosphatase: 78
Alkaline Phosphatase: 81
BUN: 3 — ABNORMAL LOW
BUN: 4 — ABNORMAL LOW
CO2: 20
Calcium: 9
Chloride: 111
Creatinine, Ser: 0.58
GFR calc Af Amer: >60
GFR calc non Af Amer: >60
Glucose, Bld: 99
Potassium: 3.4 — ABNORMAL LOW
Potassium: 4.2
Total Bilirubin: 0.6
Total Protein: 5.3 — ABNORMAL LOW

## 2011-06-19 LAB — URINALYSIS, ROUTINE W REFLEX MICROSCOPIC
Bilirubin Urine: NEGATIVE
Glucose, UA: NEGATIVE
Hgb urine dipstick: NEGATIVE
Ketones, ur: NEGATIVE
Protein, ur: NEGATIVE
pH: 6.5

## 2011-06-19 LAB — POCT URINALYSIS DIP (DEVICE)
Glucose, UA: NEGATIVE
Ketones, ur: NEGATIVE
Operator id: 297281
Protein, ur: 30 — AB
Specific Gravity, Urine: 1.02

## 2011-06-19 LAB — CBC
HCT: 31.2 — ABNORMAL LOW
HCT: 34.5 — ABNORMAL LOW
Hemoglobin: 10.8 — ABNORMAL LOW
MCHC: 34.5
MCV: 81.8
Platelets: 354
Platelets: 364
RBC: 4.22
RDW: 15.1
WBC: 7

## 2011-06-19 LAB — CREATININE CLEARANCE, URINE, 24 HOUR
Collection Interval-CRCL: 24
Creatinine, 24H Ur: 1468
Creatinine, Urine: 93.2
Creatinine: 0.73
Urine Total Volume-CRCL: 1575

## 2011-06-20 LAB — CREATININE CLEARANCE, URINE, 24 HOUR
Creatinine Clearance: 138 — ABNORMAL HIGH
Creatinine, 24H Ur: 1431
Creatinine, Urine: 76.3

## 2011-06-20 LAB — CBC
HCT: 33.3 — ABNORMAL LOW
HCT: 35.2 — ABNORMAL LOW
Hemoglobin: 11.3 — ABNORMAL LOW
Hemoglobin: 11.8 — ABNORMAL LOW
Hemoglobin: 12
MCHC: 33.5
MCHC: 33.8
MCHC: 34.3
MCV: 81.9
MCV: 82.7
Platelets: 321
Platelets: 336
RBC: 4.03
RDW: 15.7 — ABNORMAL HIGH
RDW: 15.9 — ABNORMAL HIGH

## 2011-06-20 LAB — COMPREHENSIVE METABOLIC PANEL
ALT: 16
ALT: 17
Albumin: 2.3 — ABNORMAL LOW
Alkaline Phosphatase: 87
CO2: 24
Calcium: 7.4 — ABNORMAL LOW
Calcium: 9.1
GFR calc Af Amer: >60
GFR calc non Af Amer: >60
Glucose, Bld: 94
Potassium: 3.9
Sodium: 134 — ABNORMAL LOW
Sodium: 138
Total Protein: 6.3

## 2011-06-20 LAB — STREP B DNA PROBE: Strep Group B Ag: NEGATIVE

## 2011-06-20 LAB — POCT URINALYSIS DIP (DEVICE)
Bilirubin Urine: NEGATIVE
Glucose, UA: NEGATIVE
Ketones, ur: NEGATIVE
Nitrite: NEGATIVE
Specific Gravity, Urine: 1.015

## 2011-06-20 LAB — URINE MICROSCOPIC-ADD ON

## 2011-06-20 LAB — PROTEIN, URINE, 24 HOUR
Protein, 24H Urine: 394 — ABNORMAL HIGH
Urine Total Volume-UPROT: 1875

## 2011-06-20 LAB — URIC ACID: Uric Acid, Serum: 5.7

## 2011-06-20 LAB — URINALYSIS, ROUTINE W REFLEX MICROSCOPIC
Bilirubin Urine: NEGATIVE
Ketones, ur: NEGATIVE
Nitrite: NEGATIVE
Specific Gravity, Urine: <1.005 — ABNORMAL LOW
Urobilinogen, UA: 0.2

## 2011-06-20 LAB — LACTATE DEHYDROGENASE: LDH: 142

## 2011-07-05 LAB — POCT URINALYSIS DIP (DEVICE)
Bilirubin Urine: NEGATIVE
Bilirubin Urine: NEGATIVE
Glucose, UA: NEGATIVE
Glucose, UA: NEGATIVE
Nitrite: NEGATIVE
Nitrite: NEGATIVE
Operator id: 120861
Operator id: 194561
Specific Gravity, Urine: 1.015
Specific Gravity, Urine: 1.015
Urobilinogen, UA: 0.2
Urobilinogen, UA: 0.2

## 2011-07-08 LAB — POCT URINALYSIS DIP (DEVICE)
Bilirubin Urine: NEGATIVE
Glucose, UA: NEGATIVE
Nitrite: NEGATIVE
Operator id: 194561
Urobilinogen, UA: 0.2

## 2011-07-09 LAB — POCT URINALYSIS DIP (DEVICE)
Bilirubin Urine: NEGATIVE
Bilirubin Urine: NEGATIVE
Bilirubin Urine: NEGATIVE
Glucose, UA: NEGATIVE
Hgb urine dipstick: NEGATIVE
Ketones, ur: NEGATIVE
Ketones, ur: NEGATIVE
Nitrite: NEGATIVE
Nitrite: NEGATIVE
Operator id: 194561
Urobilinogen, UA: 0.2
pH: 6.5
pH: 7

## 2011-07-09 LAB — CBC
Hemoglobin: 10.6 — ABNORMAL LOW
RBC: 3.72 — ABNORMAL LOW
WBC: 8.6

## 2011-07-09 LAB — COMPREHENSIVE METABOLIC PANEL
ALT: 15
AST: 18
Alkaline Phosphatase: 57
CO2: 24
Calcium: 8.9
Chloride: 105
GFR calc Af Amer: >60
GFR calc non Af Amer: >60
Potassium: 3.4 — ABNORMAL LOW
Sodium: 136

## 2011-07-09 LAB — CREATININE CLEARANCE, URINE, 24 HOUR
Collection Interval-CRCL: 24
Creatinine, 24H Ur: 1441
Creatinine, Urine: 68.6
Urine Total Volume-CRCL: 2100

## 2011-07-09 LAB — PROTEIN, URINE, 24 HOUR: Collection Interval-UPROT: 24

## 2011-07-10 LAB — POCT URINALYSIS DIP (DEVICE)
Bilirubin Urine: NEGATIVE
Hgb urine dipstick: NEGATIVE
Ketones, ur: NEGATIVE
pH: 7

## 2011-07-11 LAB — POCT URINALYSIS DIP (DEVICE)
Bilirubin Urine: NEGATIVE
Hgb urine dipstick: NEGATIVE
Ketones, ur: NEGATIVE
pH: 6

## 2011-07-12 LAB — CBC
HCT: 33.9 — ABNORMAL LOW
Hemoglobin: 11.7 — ABNORMAL LOW
MCHC: 34.5
RDW: 14.6 — ABNORMAL HIGH

## 2011-07-15 LAB — POCT URINALYSIS DIP (DEVICE)
Bilirubin Urine: NEGATIVE
Bilirubin Urine: NEGATIVE
Hgb urine dipstick: NEGATIVE
Hgb urine dipstick: NEGATIVE
Nitrite: NEGATIVE
Nitrite: NEGATIVE
Urobilinogen, UA: 0.2
pH: 6
pH: 6.5

## 2012-09-14 ENCOUNTER — Ambulatory Visit: Payer: Self-pay | Admitting: Internal Medicine

## 2013-05-19 ENCOUNTER — Encounter (INDEPENDENT_AMBULATORY_CARE_PROVIDER_SITE_OTHER): Payer: Self-pay | Admitting: General Surgery

## 2013-05-19 ENCOUNTER — Ambulatory Visit (INDEPENDENT_AMBULATORY_CARE_PROVIDER_SITE_OTHER): Payer: 59 | Admitting: General Surgery

## 2013-05-19 DIAGNOSIS — I10 Essential (primary) hypertension: Secondary | ICD-10-CM

## 2013-05-19 DIAGNOSIS — Z6841 Body Mass Index (BMI) 40.0 and over, adult: Secondary | ICD-10-CM

## 2013-05-19 NOTE — Progress Notes (Signed)
Subjective:   morbid obesity  Patient ID: Miranda Arroyo, female   DOB: 07-24-1981, 32 y.o.   MRN: [id number]  HPI Miranda Arroyo [id number] y.o.female presents for consideration for surgical treatment for morbid obesity.  she  gives a history of progressive obesity since eearly adulthood despite multiple attempts at medical management.  He particularly noted weight gain with birth of her children. She has been able to get 20 or 30 pounds off temporarily with diet programs but then the weight recurs.  her weight has been affecting her in a number of ways including the onset of hypertension..  She is very concerned about the health risks of her weight as she gets older and has also been told she is prediabetic. she has been to our initial information seminar, researched surgical options thoroughly and is interested in sleeve gastrectomy.  No past medical history on file. No past surgical history on file. Current Outpatient Prescriptions  Medication Sig Dispense Refill  . lisinopril-hydrochlorothiazide (PRINZIDE,ZESTORETIC) 20-12.5 MG per tablet Take 1 tablet by mouth daily.       No current facility-administered medications for this visit.   Allergies not on file History  Substance Use Topics  . Smoking status: Not on file  . Smokeless tobacco: Not on file  . Alcohol Use: Not on file     Review of Systems  Constitutional: Negative.   HENT: Negative.   Respiratory: Negative.   Cardiovascular: Negative.   Gastrointestinal: Positive for constipation. Negative for nausea, vomiting, abdominal pain, diarrhea, blood in stool, abdominal distention and rectal pain.       Occasional mild reflux but responds to oral antacids  Genitourinary: Negative.   Musculoskeletal: Negative.   Hematological: Negative.   Psychiatric/Behavioral: Negative.        Objective:   Physical Exam BP 158/100  Pulse 72  Resp 16  Ht 5' 2.5" (1.588 m)  Wt 259 lb 6.4 oz (117.663 kg)  BMI 46.66 kg/m2 General: Alert,  morbidly obese African American female, in no distress Skin: Warm and dry without rash or infection. HEENT: No palpable masses or thyromegaly. Sclera nonicteric. Pupils equal round and reactive. Oropharynx clear. Lymph nodes: No cervical, supraclavicular, or inguinal nodes palpable. Lungs: Breath sounds clear and equal without increased work of breathing Cardiovascular: Regular rate and rhythm without murmur. No JVD or edema. Peripheral pulses intact. Abdomen: Nondistended. Soft and nontender. No masses palpable. No organomegaly. No palpable hernias. Extremities: No edema or joint swelling or deformity. No chronic venous stasis changes. Neurologic: Alert and fully oriented. Gait normal.    Assessment:     32 year old female with progressive morbid obesity that has been unresponsive to multiple attempts at nonsurgical management. She presents at a BMI of 46.7 and comorbidities of hypertension. She has researched her options were thoroughly and is interested in sleeve gastrectomy. We discussed today surgical options currently available including sleeve gastrectomy, lap band and gastric bypass. At the end of our discussion she prefers sleeve gastrectomy for multiple reasons I think this would be an appropriate choice for her. We discussed the nature of the surgery, recovery, and expected results. He went to the consent form and discussed risks including anesthetic and medication complications, bleeding, infection, leak, stricture, reflux and and others. All her questions were answered.    Plan:     Procedure preoperative workup including psychologic and nutritional evaluation, upper GI series, routine lab work.

## 2013-05-24 ENCOUNTER — Other Ambulatory Visit (INDEPENDENT_AMBULATORY_CARE_PROVIDER_SITE_OTHER): Payer: Self-pay

## 2013-06-11 ENCOUNTER — Ambulatory Visit: Payer: Self-pay | Admitting: Dietician

## 2013-06-16 ENCOUNTER — Ambulatory Visit (HOSPITAL_COMMUNITY)
Admission: RE | Admit: 2013-06-16 | Discharge: 2013-06-16 | Disposition: A | Discharge status: Home / Self Care | Payer: 59 | Source: Ambulatory Visit | Attending: General Surgery | Admitting: General Surgery

## 2013-06-16 ENCOUNTER — Other Ambulatory Visit: Payer: Self-pay

## 2013-06-16 DIAGNOSIS — I10 Essential (primary) hypertension: Secondary | ICD-10-CM | POA: Insufficient documentation

## 2013-06-16 DIAGNOSIS — Z6841 Body Mass Index (BMI) 40.0 and over, adult: Secondary | ICD-10-CM | POA: Insufficient documentation

## 2013-07-09 ENCOUNTER — Encounter: Payer: Self-pay | Admitting: *Deleted

## 2013-07-09 ENCOUNTER — Other Ambulatory Visit (INDEPENDENT_AMBULATORY_CARE_PROVIDER_SITE_OTHER): Payer: Self-pay

## 2013-07-09 ENCOUNTER — Encounter: Payer: 59 | Attending: General Surgery | Admitting: *Deleted

## 2013-07-09 DIAGNOSIS — E669 Obesity, unspecified: Secondary | ICD-10-CM | POA: Insufficient documentation

## 2013-07-09 DIAGNOSIS — Z713 Dietary counseling and surveillance: Secondary | ICD-10-CM | POA: Insufficient documentation

## 2013-07-09 DIAGNOSIS — E119 Type 2 diabetes mellitus without complications: Secondary | ICD-10-CM | POA: Insufficient documentation

## 2013-07-09 DIAGNOSIS — Z01818 Encounter for other preprocedural examination: Secondary | ICD-10-CM | POA: Insufficient documentation

## 2013-07-09 LAB — CBC WITH DIFFERENTIAL/PLATELET
Basophils Relative: 0 % (ref 0–1)
Eosinophils Absolute: 0 10*3/uL (ref 0.0–0.7)
Eosinophils Relative: 1 % (ref 0–5)
HCT: 34.2 % — ABNORMAL LOW (ref 36.0–46.0)
Hemoglobin: 11 g/dL — ABNORMAL LOW (ref 12.0–15.0)
Lymphs Abs: 2 10*3/uL (ref 0.7–4.0)
MCH: 26.3 pg (ref 26.0–34.0)
MCHC: 32.2 g/dL (ref 30.0–36.0)
MCV: 81.6 fL (ref 78.0–100.0)
Monocytes Absolute: 0.3 10*3/uL (ref 0.1–1.0)
Monocytes Relative: 5 % (ref 3–12)
Neutrophils Relative %: 61 % (ref 43–77)
RBC: 4.19 MIL/uL (ref 3.87–5.11)

## 2013-07-09 NOTE — Patient Instructions (Signed)
   Follow Pre-Op Nutrition Goals to prepare for Sleeve Gastrectomy Surgery.   Call the [facility] at [phone] once you have been given your surgery date to enrolled in the Pre-Op Nutrition Class. You will need to attend this nutrition class 3-4 weeks prior to your surgery.  

## 2013-07-10 LAB — COMPREHENSIVE METABOLIC PANEL
BUN: 8 mg/dL (ref 6–23)
CO2: 29 mEq/L (ref 19–32)
Glucose, Bld: 92 mg/dL (ref 70–99)
Sodium: 139 mEq/L (ref 135–145)
Total Bilirubin: 0.6 mg/dL (ref 0.3–1.2)
Total Protein: 6.6 g/dL (ref 6.0–8.3)

## 2013-07-10 LAB — LIPID PANEL
Cholesterol: 157 mg/dL (ref 0–200)
Total CHOL/HDL Ratio: 3.5 Ratio
Triglycerides: 74 mg/dL (ref <150)

## 2013-07-10 LAB — TSH: TSH: 1.329 u[IU]/mL (ref 0.350–4.500)

## 2013-07-16 NOTE — Progress Notes (Signed)
  Pre-Op Assessment Visit:  Pre-Operative Sleeve Gastrectomy Surgery  Medical Nutrition Therapy:  Appt start time:  815   End time:  915.  Patient was seen on 07/09/13 for Pre-Operative Sleeve Gastrectomy Nutrition Assessment. Assessment and letter of approval faxed to [facility] Bariatric Surgery Program coordinator on 07/12/13.   Handouts given during visit include:  Pre-Op Goals Bariatric Surgery Protein Shakes  Samples given during visit include:   Nectar Protein Powder: 1 pkt  Lot: [id number]; Exp: 01/17  Unjury Protein Powder: 1 pkt Lot: [id number]; Exp: 01/16  Patient to call the [facility] to enroll in Pre-Op and Post-Op Nutrition Education when surgery date is scheduled.

## 2013-11-18 ENCOUNTER — Encounter: Payer: 59 | Attending: General Surgery

## 2013-11-18 DIAGNOSIS — Z713 Dietary counseling and surveillance: Secondary | ICD-10-CM | POA: Insufficient documentation

## 2013-11-18 DIAGNOSIS — E669 Obesity, unspecified: Secondary | ICD-10-CM | POA: Insufficient documentation

## 2013-11-18 DIAGNOSIS — Z01818 Encounter for other preprocedural examination: Secondary | ICD-10-CM | POA: Insufficient documentation

## 2013-11-18 NOTE — Progress Notes (Signed)
  Pre-Operative Nutrition Class:  Appt start time: 0830   End time:  1000.  Patient was seen on 11/18/2013 for Pre-Operative Bariatric Surgery Education at the [facility].   Surgery date: 12/13/2013 Surgery type: Gastric sleeve Start weight at [facility]: 258 lbs on 07/09/2014 Weight today: 271 lbs  TANITA  BODY COMP RESULTS  10/18/2013   BMI (kg/m^2) 48.8   Fat Mass (lbs) 149.5   Fat Free Mass (lbs) 121.5   Total Body Water (lbs) 89   Samples given per MNT protocol. Patient educated on appropriate usage:  Premier Protein shake (vanilla) Lot #: [id number] Exp: 06/2014   Celebrate Vitamins Multivitamin (grape) Lot #: [id number] Exp: 09/2014   Renee Pain Protein Powder (strawberry) Lot #: [id number] Exp: 09/2014  The following the learning objectives were met by the patient during this course:  Identify Pre-Op Dietary Goals and will begin 2 weeks pre-operatively  Identify appropriate sources of fluids and proteins   State protein recommendations and appropriate sources pre and post-operatively  Identify Post-Operative Dietary Goals and will follow for 2 weeks post-operatively  Identify appropriate multivitamin and calcium sources  Describe the need for physical activity post-operatively and will follow MD recommendations  State when to call healthcare provider regarding medication questions or post-operative complications  Handouts given during class include:  Pre-Op Bariatric Surgery Diet Handout  Protein Shake Handout  Post-Op Bariatric Surgery Nutrition Handout  BELT Program Information Flyer  Support Group Information Flyer  [facility] Bariatric Supplements Price List  Follow-Up Plan: Patient will follow-up at [facility] 2 weeks post operatively for diet advancement per MD.

## 2013-11-30 NOTE — Progress Notes (Signed)
Surgery scheduled for 12/13/13.  Proep on 12/08/13 at 0800am.  Need orders in EPIC.  Thank You.

## 2013-12-02 NOTE — Progress Notes (Signed)
  Surgery on 12/13/13.  Porep on 12/08/13 at 0800am.  Need orders in EPIC.  Thank You.

## 2013-12-03 ENCOUNTER — Encounter (HOSPITAL_COMMUNITY): Payer: Self-pay | Admitting: Pharmacy Technician

## 2013-12-03 NOTE — Progress Notes (Signed)
Surgery on 12/13/13.  Preop on 12/08/13 at 0800am.  Need orders in EPIC.  Thank You.

## 2013-12-07 NOTE — Progress Notes (Signed)
Need MD order entry in Epic- PAT appointment 12-08-13 0800.

## 2013-12-08 ENCOUNTER — Encounter (INDEPENDENT_AMBULATORY_CARE_PROVIDER_SITE_OTHER): Payer: Self-pay

## 2013-12-08 ENCOUNTER — Encounter (HOSPITAL_COMMUNITY)
Admission: RE | Admit: 2013-12-08 | Discharge: 2013-12-08 | Disposition: A | Discharge status: Home / Self Care | Payer: 59 | Source: Ambulatory Visit | Attending: General Surgery | Admitting: General Surgery

## 2013-12-08 ENCOUNTER — Encounter (HOSPITAL_COMMUNITY): Payer: Self-pay

## 2013-12-08 DIAGNOSIS — Z01812 Encounter for preprocedural laboratory examination: Secondary | ICD-10-CM | POA: Insufficient documentation

## 2013-12-08 HISTORY — PX: CERVICAL CERCLAGE: SHX1329

## 2013-12-08 LAB — CBC
HEMATOCRIT: 33.5 % — AB (ref 36.0–46.0)
Hemoglobin: 10.9 g/dL — ABNORMAL LOW (ref 12.0–15.0)
MCH: 25.6 pg — ABNORMAL LOW (ref 26.0–34.0)
MCHC: 32.5 g/dL (ref 30.0–36.0)
MCV: 78.8 fL (ref 78.0–100.0)
PLATELETS: 395 10*3/uL (ref 150–400)
RBC: 4.25 MIL/uL (ref 3.87–5.11)
RDW: 15.2 % (ref 11.5–15.5)
WBC: 5.4 10*3/uL (ref 4.0–10.5)

## 2013-12-08 LAB — BASIC METABOLIC PANEL
BUN: 8 mg/dL (ref 6–23)
CHLORIDE: 102 meq/L (ref 96–112)
CO2: 27 mEq/L (ref 19–32)
CREATININE: 0.72 mg/dL (ref 0.50–1.10)
Calcium: 9.2 mg/dL (ref 8.4–10.5)
Glucose, Bld: 98 mg/dL (ref 70–99)
Potassium: 3.6 mEq/L — ABNORMAL LOW (ref 3.7–5.3)
Sodium: 139 mEq/L (ref 137–147)

## 2013-12-08 LAB — HCG, SERUM, QUALITATIVE: PREG SERUM: NEGATIVE

## 2013-12-08 NOTE — Patient Instructions (Signed)
[address] Miranda Arroyo  12/08/2013   Your procedure is scheduled on:3-16   -2015  Report to [facility] at     0515   AM.  Call this number if you have problems the morning of surgery: [phone]  Or Presurgical Testing [phone](Shylyn Younce) For Living Will and/or Health Care Power Attorney Forms: please provide copy for your medical record,may bring AM of surgery(Forms should be already notarized -we do not provide this service).(12-08-13 Prefers no further information today).  Remember: Follow any bowel prep instructions per MD office. For Cpap use: Bring mask and tubing only.   Do not eat food:After Midnight.    Take these medicines the morning of surgery with A SIP OF WATER: none.   Do not wear jewelry, make-up or nail polish.  Do not wear lotions, powders, or perfumes. You may wear deodorant.  Do not shave 48 hours(2 days) prior to first CHG shower(legs and under arms).(Shaving face and neck okay.)  Do not bring valuables to the hospital.(Hospital is not responsible for lost valuables).  Contacts, dentures or removable bridgework, body piercing, hair pins may not be worn into surgery.  Leave suitcase in the car. After surgery it may be brought to your room.  For patients admitted to the hospital, checkout time is 11:00 AM the day of discharge.(Restricted visitors-Any Persons displaying flu-like symptoms or illness).    Patients discharged the day of surgery will not be allowed to drive home. Must have responsible person with you x 24 hours once discharged.  Name and phone number of your driver: Fernande BrasDonald Holte,brother [phone] cell/ pt's cell [phone]   Special Instructions: CHG(Chlorhedine 4%-"Hibiclens","Betasept","Aplicare") Shower Use Special Wash: see special instructions.(avoid face and genitals)   Please read over the following fact sheets that you were given:  Incentive Spirometry Instruction.  Remember : Type/Screen "Blue armbands" - may not be removed once  applied(would result in being retested AM of surgery, if removed).  Failure to follow these instructions may result in Cancellation of your surgery.   Patient signature_______________________________________________________

## 2013-12-08 NOTE — Pre-Procedure Instructions (Signed)
12-08-13 EKG / CXR 9'14 -Epic

## 2013-12-10 ENCOUNTER — Encounter (INDEPENDENT_AMBULATORY_CARE_PROVIDER_SITE_OTHER): Payer: Self-pay | Admitting: General Surgery

## 2013-12-10 ENCOUNTER — Ambulatory Visit (INDEPENDENT_AMBULATORY_CARE_PROVIDER_SITE_OTHER): Payer: 59 | Admitting: General Surgery

## 2013-12-10 ENCOUNTER — Telehealth (INDEPENDENT_AMBULATORY_CARE_PROVIDER_SITE_OTHER): Payer: Self-pay

## 2013-12-10 NOTE — Telephone Encounter (Signed)
[facility] is asking for orders Patient scheduled for bariatric sleeve   sx 12-13-13 830am

## 2013-12-10 NOTE — Progress Notes (Signed)
Text paged Dr. Hoxworth for orders.  

## 2013-12-10 NOTE — Progress Notes (Signed)
Chief complaint: Preop sleeve gastrectomy  History: Patient is a office for a preop visit prior to planned laparoscopic sleeve gastrectomy for morbid obesity. She has successfully completed her preoperative workup. No concerns were psychologic or nutrition evaluations. Her chest x-ray and upper GI series were unremarkable. She has mild anemia but no other significant laboratory abnormalities.  Past Medical History  Diagnosis Date  . Hypertension   . H/O gestational diabetes mellitus, not currently pregnant   . Morbid obesity with BMI of 45.0-49.9, adult    Past Surgical History  Procedure Laterality Date  . Cesarean section  2006  . Cervical cerclage  12-08-13    '06,'09   Current Outpatient Prescriptions  Medication Sig Dispense Refill  . lisinopril-hydrochlorothiazide (PRINZIDE,ZESTORETIC) 20-12.5 MG per tablet Take 1 tablet by mouth every morning.        No current facility-administered medications for this visit.   No Known Allergies History  Substance Use Topics  . Smoking status: Never Smoker   . Smokeless tobacco: Not on file  . Alcohol Use: No   Exam: BP 144/100  Temp(Src) 99 F (37.2 C) (Oral)  Resp 16  Ht 5\' 2"  (1.575 m)  Wt 273 lb (123.832 kg)  BMI 49.92 kg/m2  LMP 12/06/2013 General: Morbidly obese but otherwise well-appearing African female Skin: No rash or infection Lungs: Clear equal breath sounds bilaterally Cardiac: Regular rate and rhythm. No edema Abdomen: Soft and nontender. No hernias or organomegaly Extremities: No edema  Assessment and plan: Ready to proceed with laparoscopic sleeve gastrectomy. We again reviewed the consent form line by line. All her questions were answered. She is sticking to her preop diet.

## 2013-12-13 ENCOUNTER — Encounter (HOSPITAL_COMMUNITY)
Admission: RE | Disposition: A | Discharge status: Home / Self Care | Payer: 59 | Source: Ambulatory Visit | Attending: General Surgery

## 2013-12-13 ENCOUNTER — Inpatient Hospital Stay (HOSPITAL_COMMUNITY)
Admission: RE | Admit: 2013-12-13 | Discharge: 2013-12-15 | DRG: 621 | Disposition: A | Discharge status: Home / Self Care | Payer: 59 | Source: Ambulatory Visit | Attending: General Surgery | Admitting: General Surgery

## 2013-12-13 ENCOUNTER — Encounter (HOSPITAL_COMMUNITY): Payer: 59 | Admitting: *Deleted

## 2013-12-13 ENCOUNTER — Encounter (HOSPITAL_COMMUNITY): Payer: Self-pay

## 2013-12-13 ENCOUNTER — Inpatient Hospital Stay (HOSPITAL_COMMUNITY): Payer: 59 | Admitting: *Deleted

## 2013-12-13 DIAGNOSIS — I10 Essential (primary) hypertension: Secondary | ICD-10-CM | POA: Diagnosis present

## 2013-12-13 DIAGNOSIS — Z79899 Other long term (current) drug therapy: Secondary | ICD-10-CM

## 2013-12-13 DIAGNOSIS — D649 Anemia, unspecified: Secondary | ICD-10-CM | POA: Diagnosis present

## 2013-12-13 DIAGNOSIS — Z8632 Personal history of gestational diabetes: Secondary | ICD-10-CM

## 2013-12-13 DIAGNOSIS — Z6841 Body Mass Index (BMI) 40.0 and over, adult: Secondary | ICD-10-CM

## 2013-12-13 DIAGNOSIS — Z01812 Encounter for preprocedural laboratory examination: Secondary | ICD-10-CM

## 2013-12-13 HISTORY — PX: LAPAROSCOPIC GASTRIC SLEEVE RESECTION: SHX5895

## 2013-12-13 LAB — HEMOGLOBIN AND HEMATOCRIT, BLOOD
HCT: 33.2 % — ABNORMAL LOW (ref 36.0–46.0)
Hemoglobin: 10.9 g/dL — ABNORMAL LOW (ref 12.0–15.0)

## 2013-12-13 SURGERY — GASTRECTOMY, SLEEVE, LAPAROSCOPIC
Anesthesia: General | Site: Abdomen

## 2013-12-13 MED ORDER — METOCLOPRAMIDE HCL 5 MG/ML IJ SOLN
INTRAMUSCULAR | Status: AC
  Filled 2013-12-13: qty 2

## 2013-12-13 MED ORDER — MIDAZOLAM HCL 2 MG/2ML IJ SOLN
INTRAMUSCULAR | Status: AC
  Filled 2013-12-13: qty 2

## 2013-12-13 MED ORDER — HYDROMORPHONE HCL PF 1 MG/ML IJ SOLN
0.2500 mg | INTRAMUSCULAR | Status: DC | PRN
  Administered 2013-12-13: 0.5 mg via INTRAVENOUS
  Administered 2013-12-13 (×2): 0.25 mg via INTRAVENOUS

## 2013-12-13 MED ORDER — HEPARIN SODIUM (PORCINE) 5000 UNIT/ML IJ SOLN
5000.0000 [IU] | INTRAMUSCULAR | Status: AC
  Administered 2013-12-13: 5000 [IU] via SUBCUTANEOUS
  Filled 2013-12-13: qty 1

## 2013-12-13 MED ORDER — NEOSTIGMINE METHYLSULFATE 1 MG/ML IJ SOLN
INTRAMUSCULAR | Status: AC
  Filled 2013-12-13: qty 10

## 2013-12-13 MED ORDER — PHENYLEPHRINE 40 MCG/ML (10ML) SYRINGE FOR IV PUSH (FOR BLOOD PRESSURE SUPPORT)
PREFILLED_SYRINGE | INTRAVENOUS | Status: AC
  Filled 2013-12-13: qty 10

## 2013-12-13 MED ORDER — TISSEEL VH 10 ML EX KIT
PACK | CUTANEOUS | Status: DC | PRN
Start: 2013-12-13 — End: 2013-12-13
  Administered 2013-12-13: 10 mL

## 2013-12-13 MED ORDER — GLYCOPYRROLATE 0.2 MG/ML IJ SOLN
INTRAMUSCULAR | Status: DC | PRN
  Administered 2013-12-13: 0.6 mg via INTRAVENOUS

## 2013-12-13 MED ORDER — ONDANSETRON HCL 4 MG/2ML IJ SOLN
INTRAMUSCULAR | Status: DC | PRN
  Administered 2013-12-13: 4 mg via INTRAVENOUS

## 2013-12-13 MED ORDER — ESMOLOL HCL 10 MG/ML IV SOLN
INTRAVENOUS | Status: DC | PRN
  Administered 2013-12-13: 30 mg via INTRAVENOUS
  Administered 2013-12-13 (×2): 20 mg via INTRAVENOUS

## 2013-12-13 MED ORDER — ROCURONIUM BROMIDE 100 MG/10ML IV SOLN
INTRAVENOUS | Status: DC | PRN
  Administered 2013-12-13: 50 mg via INTRAVENOUS

## 2013-12-13 MED ORDER — ACETAMINOPHEN 10 MG/ML IV SOLN
1000.0000 mg | Freq: Four times a day (QID) | INTRAVENOUS | Status: DC
  Filled 2013-12-13 (×4): qty 100

## 2013-12-13 MED ORDER — SODIUM CHLORIDE 0.9 % IJ SOLN
INTRAMUSCULAR | Status: AC
  Filled 2013-12-13: qty 3

## 2013-12-13 MED ORDER — CHLORHEXIDINE GLUCONATE CLOTH 2 % EX PADS: 6.0000 | MEDICATED_PAD | Freq: Once | CUTANEOUS | Status: DC

## 2013-12-13 MED ORDER — DEXAMETHASONE SODIUM PHOSPHATE 4 MG/ML IJ SOLN
INTRAMUSCULAR | Status: DC | PRN
  Administered 2013-12-13: 10 mg via INTRAVENOUS

## 2013-12-13 MED ORDER — BUPIVACAINE-EPINEPHRINE 0.25% -1:200000 IJ SOLN
INTRAMUSCULAR | Status: AC
  Filled 2013-12-13: qty 1

## 2013-12-13 MED ORDER — FENTANYL CITRATE 0.05 MG/ML IJ SOLN
INTRAMUSCULAR | Status: DC | PRN
  Administered 2013-12-13 (×4): 50 ug via INTRAVENOUS
  Administered 2013-12-13: 100 ug via INTRAVENOUS
  Administered 2013-12-13 (×2): 50 ug via INTRAVENOUS

## 2013-12-13 MED ORDER — PROPOFOL 10 MG/ML IV BOLUS
INTRAVENOUS | Status: DC | PRN
  Administered 2013-12-13: 150 mg via INTRAVENOUS

## 2013-12-13 MED ORDER — ONDANSETRON HCL 4 MG/2ML IJ SOLN
4.0000 mg | INTRAMUSCULAR | Status: DC | PRN
  Administered 2013-12-13 – 2013-12-14 (×4): 4 mg via INTRAVENOUS
  Filled 2013-12-13 (×4): qty 2

## 2013-12-13 MED ORDER — BUPIVACAINE-EPINEPHRINE 0.25% -1:200000 IJ SOLN
INTRAMUSCULAR | Status: DC | PRN
  Administered 2013-12-13: 34 mL

## 2013-12-13 MED ORDER — HYDROMORPHONE HCL PF 1 MG/ML IJ SOLN
INTRAMUSCULAR | Status: AC
Start: 2013-12-13 — End: 2013-12-13
  Filled 2013-12-13: qty 1

## 2013-12-13 MED ORDER — ACETAMINOPHEN 160 MG/5ML PO SOLN
650.0000 mg | ORAL | Status: DC | PRN
  Administered 2013-12-14: 650 mg via ORAL
  Filled 2013-12-13: qty 20.3

## 2013-12-13 MED ORDER — METOCLOPRAMIDE HCL 5 MG/ML IJ SOLN
INTRAMUSCULAR | Status: DC | PRN
  Administered 2013-12-13: 10 mg via INTRAVENOUS

## 2013-12-13 MED ORDER — HEPARIN SODIUM (PORCINE) 5000 UNIT/ML IJ SOLN
5000.0000 [IU] | Freq: Three times a day (TID) | INTRAMUSCULAR | Status: DC
  Administered 2013-12-13 – 2013-12-15 (×5): 5000 [IU] via SUBCUTANEOUS
  Filled 2013-12-13 (×8): qty 1

## 2013-12-13 MED ORDER — LACTATED RINGERS IV SOLN: INTRAVENOUS | Status: DC

## 2013-12-13 MED ORDER — TISSEEL VH 10 ML EX KIT
PACK | CUTANEOUS | Status: AC
  Filled 2013-12-13: qty 1

## 2013-12-13 MED ORDER — POTASSIUM CHLORIDE IN NACL 20-0.9 MEQ/L-% IV SOLN
INTRAVENOUS | Status: DC
  Administered 2013-12-13 – 2013-12-14 (×3): via INTRAVENOUS
  Filled 2013-12-13 (×7): qty 1000

## 2013-12-13 MED ORDER — ONDANSETRON HCL 4 MG/2ML IJ SOLN
INTRAMUSCULAR | Status: AC
  Filled 2013-12-13: qty 2

## 2013-12-13 MED ORDER — MORPHINE SULFATE 2 MG/ML IJ SOLN
2.0000 mg | INTRAMUSCULAR | Status: DC | PRN
  Administered 2013-12-13 – 2013-12-14 (×5): 2 mg via INTRAVENOUS
  Filled 2013-12-13 (×5): qty 1

## 2013-12-13 MED ORDER — ROCURONIUM BROMIDE 100 MG/10ML IV SOLN
INTRAVENOUS | Status: AC
  Filled 2013-12-13: qty 1

## 2013-12-13 MED ORDER — DEXAMETHASONE SODIUM PHOSPHATE 10 MG/ML IJ SOLN
INTRAMUSCULAR | Status: AC
  Filled 2013-12-13: qty 1

## 2013-12-13 MED ORDER — OXYCODONE HCL 5 MG/5ML PO SOLN: 5.0000 mg | ORAL | Status: DC | PRN

## 2013-12-13 MED ORDER — PROPOFOL 10 MG/ML IV BOLUS
INTRAVENOUS | Status: AC
  Filled 2013-12-13: qty 20

## 2013-12-13 MED ORDER — KETAMINE HCL 10 MG/ML IJ SOLN
INTRAMUSCULAR | Status: DC | PRN
  Administered 2013-12-13: 30 mg via INTRAVENOUS

## 2013-12-13 MED ORDER — UNJURY CHOCOLATE CLASSIC POWDER: 2.0000 [oz_av] | Freq: Four times a day (QID) | ORAL | Status: DC

## 2013-12-13 MED ORDER — DEXTROSE 5 % IV SOLN
INTRAVENOUS | Status: AC
  Filled 2013-12-13 (×2): qty 1

## 2013-12-13 MED ORDER — ACETAMINOPHEN 160 MG/5ML PO SOLN: 325.0000 mg | ORAL | Status: DC | PRN

## 2013-12-13 MED ORDER — MIDAZOLAM HCL 5 MG/5ML IJ SOLN
INTRAMUSCULAR | Status: DC | PRN
  Administered 2013-12-13: 2 mg via INTRAVENOUS

## 2013-12-13 MED ORDER — ACETAMINOPHEN 10 MG/ML IV SOLN: 1000.0000 mg | Freq: Once | INTRAVENOUS | Status: DC

## 2013-12-13 MED ORDER — CEFOXITIN SODIUM 2 G IV SOLR
2.0000 g | INTRAVENOUS | Status: AC
  Administered 2013-12-13: 2 g via INTRAVENOUS
  Filled 2013-12-13: qty 2

## 2013-12-13 MED ORDER — UNJURY CHICKEN SOUP POWDER: 2.0000 [oz_av] | Freq: Four times a day (QID) | ORAL | Status: DC

## 2013-12-13 MED ORDER — NEOSTIGMINE METHYLSULFATE 1 MG/ML IJ SOLN
INTRAMUSCULAR | Status: DC | PRN
  Administered 2013-12-13: 5 mg via INTRAVENOUS

## 2013-12-13 MED ORDER — GLYCOPYRROLATE 0.2 MG/ML IJ SOLN
INTRAMUSCULAR | Status: AC
  Filled 2013-12-13: qty 3

## 2013-12-13 MED ORDER — FENTANYL CITRATE 0.05 MG/ML IJ SOLN
INTRAMUSCULAR | Status: AC
  Filled 2013-12-13: qty 5

## 2013-12-13 MED ORDER — UNJURY VANILLA POWDER
2.0000 [oz_av] | Freq: Four times a day (QID) | ORAL | Status: DC
  Administered 2013-12-15: 2 [oz_av] via ORAL

## 2013-12-13 MED ORDER — LACTATED RINGERS IV SOLN
INTRAVENOUS | Status: DC | PRN
  Administered 2013-12-13 (×2): via INTRAVENOUS

## 2013-12-13 MED ORDER — ACETAMINOPHEN 10 MG/ML IV SOLN
1000.0000 mg | Freq: Once | INTRAVENOUS | Status: AC
  Administered 2013-12-13: 1000 mg via INTRAVENOUS
  Filled 2013-12-13: qty 100

## 2013-12-13 SURGICAL SUPPLY — 62 items
ADH SKN CLS APL DERMABOND .7 (GAUZE/BANDAGES/DRESSINGS) ×1
APL SRG 32X5 SNPLK LF DISP (MISCELLANEOUS) ×1
APPLICATOR COTTON TIP 6IN STRL (MISCELLANEOUS) IMPLANT
APPLIER CLIP ROT 10 11.4 M/L (STAPLE)
APPLIER CLIP ROT 13.4 12 LRG (CLIP)
APR CLP LRG 13.4X12 ROT 20 MLT (CLIP)
APR CLP MED LRG 11.4X10 (STAPLE)
BAG SPEC RTRVL LRG 6X4 10 (ENDOMECHANICALS)
BLADE SURG SZ11 CARB STEEL (BLADE) ×3 IMPLANT
CABLE HIGH FREQUENCY MONO STRZ (ELECTRODE) ×2 IMPLANT
CANISTER SUCTION 2500CC (MISCELLANEOUS) ×3 IMPLANT
CHLORAPREP W/TINT 26ML (MISCELLANEOUS) ×5 IMPLANT
CLIP APPLIE ROT 10 11.4 M/L (STAPLE) IMPLANT
CLIP APPLIE ROT 13.4 12 LRG (CLIP) IMPLANT
DERMABOND ADVANCED (GAUZE/BANDAGES/DRESSINGS) ×2
DERMABOND ADVANCED .7 DNX12 (GAUZE/BANDAGES/DRESSINGS) ×1 IMPLANT
DEVICE SUTURE ENDOST 10MM (ENDOMECHANICALS) IMPLANT
DEVICE TROCAR PUNCTURE CLOSURE (ENDOMECHANICALS) ×2 IMPLANT
DRAPE CAMERA CLOSED 9X96 (DRAPES) ×3 IMPLANT
DRAPE UTILITY XL STRL (DRAPES) ×6 IMPLANT
ELECT REM PT RETURN 9FT ADLT (ELECTROSURGICAL) ×3
ELECTRODE REM PT RTRN 9FT ADLT (ELECTROSURGICAL) ×1 IMPLANT
GLOVE BIOGEL PI IND STRL 7.5 (GLOVE) ×1 IMPLANT
GLOVE BIOGEL PI INDICATOR 7.5 (GLOVE) ×10
GLOVE SS BIOGEL STRL SZ 7.5 (GLOVE) ×1 IMPLANT
GLOVE SUPERSENSE BIOGEL SZ 7.5 (GLOVE) ×4
GOWN STRL REUS W/TWL XL LVL3 (GOWN DISPOSABLE) ×19 IMPLANT
HOVERMATT SINGLE USE (MISCELLANEOUS) ×3 IMPLANT
KIT BASIN OR (CUSTOM PROCEDURE TRAY) ×3 IMPLANT
NDL SPNL 22GX3.5 QUINCKE BK (NEEDLE) ×1 IMPLANT
NEEDLE SPNL 22GX3.5 QUINCKE BK (NEEDLE) ×3 IMPLANT
PACK UNIVERSAL I (CUSTOM PROCEDURE TRAY) ×3 IMPLANT
PENCIL BUTTON HOLSTER BLD 10FT (ELECTRODE) ×3 IMPLANT
POUCH SPECIMEN RETRIEVAL 10MM (ENDOMECHANICALS) IMPLANT
RELOAD BLUE (STAPLE) ×10 IMPLANT
RELOAD GOLD (STAPLE) ×3 IMPLANT
RELOAD GREEN (STAPLE) ×3 IMPLANT
SCISSORS LAP 5X35 DISP (ENDOMECHANICALS) ×2 IMPLANT
SEALANT SURGICAL APPL DUAL CAN (MISCELLANEOUS) ×3 IMPLANT
SET IRRIG TUBING LAPAROSCOPIC (IRRIGATION / IRRIGATOR) ×3 IMPLANT
SHEARS CURVED HARMONIC AC 45CM (MISCELLANEOUS) ×3 IMPLANT
SLEEVE ADV FIXATION 5X100MM (TROCAR) ×5 IMPLANT
SLEEVE GASTRECTOMY 36FR VISIGI (MISCELLANEOUS) ×3 IMPLANT
SOLUTION ANTI FOG 6CC (MISCELLANEOUS) ×3 IMPLANT
SPONGE GAUZE 4X4 12PLY (GAUZE/BANDAGES/DRESSINGS) ×2 IMPLANT
SPONGE LAP 18X18 X RAY DECT (DISPOSABLE) ×3 IMPLANT
STAPLE ECHEON FLEX 60 POW ENDO (STAPLE) ×3 IMPLANT
SUT ETHILON 2 0 PS N (SUTURE) IMPLANT
SUT MNCRL AB 4-0 PS2 18 (SUTURE) ×1 IMPLANT
SUT VIC AB 0 UR5 27 (SUTURE) ×2 IMPLANT
SYR 20CC LL (SYRINGE) ×3 IMPLANT
SYR CONTROL 10ML LL (SYRINGE) ×2 IMPLANT
TOWEL OR NON WOVEN STRL DISP B (DISPOSABLE) ×3 IMPLANT
TRAY FOLEY CATH 14FRSI W/METER (CATHETERS) ×5 IMPLANT
TROCAR ADV FIXATION 12X100MM (TROCAR) ×2 IMPLANT
TROCAR ADV FIXATION 5X100MM (TROCAR) ×3 IMPLANT
TROCAR BLADELESS 15MM (ENDOMECHANICALS) ×3 IMPLANT
TROCAR XCEL NON-BLD 5MMX100MML (ENDOMECHANICALS) ×3 IMPLANT
TUBING CONNECTING 10 (TUBING) ×2 IMPLANT
TUBING CONNECTING 10' (TUBING) ×1
TUBING ENDO SMARTCAP PENTAX (MISCELLANEOUS) ×3 IMPLANT
TUBING FILTER THERMOFLATOR (ELECTROSURGICAL) ×3 IMPLANT

## 2013-12-13 NOTE — Anesthesia Postprocedure Evaluation (Signed)
  Anesthesia Post-op Note  Patient: Miranda Arroyo  Procedure(s) Performed: Procedure(s) (LRB): LAPAROSCOPIC GASTRIC SLEEVE RESECTION, upper endoscopy (N/A)  Patient Location: PACU  Anesthesia Type: General  Level of Consciousness: awake and alert   Airway and Oxygen Therapy: Patient Spontanous Breathing  Post-op Pain: mild  Post-op Assessment: Post-op Vital signs reviewed, Patient's Cardiovascular Status Stable, Respiratory Function Stable, Patent Airway and No signs of Nausea or vomiting  Last Vitals:  Filed Vitals:   12/13/13 0513  BP: 169/102  Pulse: 84  Temp: 37 C  Resp: 18    Post-op Vital Signs: stable   Complications: No apparent anesthesia complications

## 2013-12-13 NOTE — Transfer of Care (Signed)
Immediate Anesthesia Transfer of Care Note  Patient: Miranda Arroyo  Procedure(s) Performed: Procedure(s): LAPAROSCOPIC GASTRIC SLEEVE RESECTION, upper endoscopy (N/A)  Patient Location: PACU  Anesthesia Type:General  Level of Consciousness: Patient easily awoken, sedated, comfortable, cooperative, following commands, responds to stimulation.   Airway & Oxygen Therapy: Patient spontaneously breathing, ventilating well, oxygen via simple oxygen mask.  Post-op Assessment: Report given to PACU RN, vital signs reviewed and stable, moving all extremities.   Post vital signs: Reviewed and stable.  Complications: No apparent anesthesia complications

## 2013-12-13 NOTE — Op Note (Signed)
Preoperative Diagnosis: morbid obesity   Postoprative Diagnosis: morbid obesity   Procedure: Procedure(s): LAPAROSCOPIC GASTRIC SLEEVE RESECTION, upper endoscopy   Surgeon: Glenna Fellows T   Assistants: Luretha Murphy  Anesthesia:  General endotracheal anesthesia  Indications: patient is a 33 year old female with progressive morbid obesity unresponsive to medical management he presents at BMI 50 with comorbidities of hypertension and history of gestational diabetes. After extensive preoperative workup and discussion detailed elsewhere we have elected to proceed with laparoscopic sleeve gastrectomy for treatment of her morbid obesity.    Procedure Detail:  Patient was brought to the operating room, placed in supine position on the operating table, and general endotracheal anesthesia was induced. Foley catheter was placed. She received preoperative IV antibiotics and subcutaneous heparin. The abdomen was widely sterilely prepped and draped. Patient Timeout was performed and correct procedure verified. Access was obtained in the left upper quadrant with a 5 mm Optiview trocar without difficulty and pneumoperitoneum established. Laparoscopy showed no evidence of trocar injury. Under direct vision a 12 mm trocar was placed laterally in the right upper quadrant and more medially in the right upper quadrant the base of the falciform ligament. A 5 mm trocar was placed just to the right of the umbilicus for the camera port. Through a 5 mm subxiphoid site the [facility] retractor was placed in the left lobe of the liver elevated with very good exposure of the entire stomach and hiatus. The patient had a normal upper GI series and there was no evidence of hiatal hernia. I measured approximately 5-6 cm from the pylorus and beginning at the greater curve along the stomach the lesser omentum was divided with the harmonic scalpel and the lesser sac entered without difficulty. We continued along the greater  curve with the Harmonic scalpel working proximally dividing the lesser omentum. Short gastric vessels were divided and there were minimal adhesions to the spleen were easily taken down with the harmonic scalpel and the stomach completely mobilized up to the angle of Hiss. The right crus was completely dissected and exposed. There were no posterior adhesions of the stomach and at this point the stomach was completely free over to the lesser curve. I then dissected a little more distally along the stomach with harmonic scalpel prior to the first stapling. The VisiG 29 French tube had been previously placed in the stomach and used for decompression. It was advanced to the pylorus in position along the lesser curve and suction applied with very good positioning of the tube directly in the lesser curve.An initial firing of the green load echelon 60 mm stapler was placed partially onto the stomach beginning about 5 cm from the pylorus staying well away from the lesser curve ended suture with the initial firing it was fired without difficulty. A second firing using the gold load echelon stapler was performed carrying this up just past the incisura and staying away from the tube allowing a little extra room at the incisura. Following this several firings of the blue load echelon 60 mm stapler were performed right along the edge of the tube working up toward the angle of Hiss. A final firing was angled outward slightly to stay away from the periesophageal fat pad complete the sleeve. The tube was insufflated under saline irrigation there was no evidence of leak and the stomach was suctioned and the tube withdrawn. Dr. Daphine Deutscher then went above and performed upper endoscopy that showed no stricturing or narrowing with a nice symmetrical tube and no leak. The gastrectomy  specimen was brought out through the 15 mm trocar site after slightly dilating. The staple line of the sleeve was coated with Tisseel tissue sealant. The 15 mm  trocar site was closed with the Endo Close device with 0 Vicryl. There was no visible bleeding or bowel injury or other problems. The Nathanson retractor was removed under direct vision all CO2 evacuated and trochars removed. Skin incisions were closed with subcutaneous 2-0 Monocryl and Dermabond. Sponge needle and ischemic counts were correct.   Estimated Blood Loss:  Minimal         Drains: none  Blood Given: none          Specimens: a sleeve gastrectomy        Complications:  * No complications entered in OR log *         Disposition: PACU - hemodynamically stable.         Condition: stable

## 2013-12-13 NOTE — Interval H&P Note (Signed)
History and Physical Interval Note:  12/13/2013 7:09 AM  Miranda Arroyo  has presented today for surgery, with the diagnosis of morbid obesity   The various methods of treatment have been discussed with the patient and family. After consideration of risks, benefits and other options for treatment, the patient has consented to  Procedure(s): LAPAROSCOPIC GASTRIC SLEEVE RESECTION (N/A) as a surgical intervention .  The patient's history has been reviewed, patient examined, no change in status, stable for surgery.  I have reviewed the patient's chart and labs.  Questions were answered to the patient's satisfaction.     [name] T

## 2013-12-13 NOTE — H&P (View-Only) (Signed)
Chief complaint: Preop sleeve gastrectomy  History: Patient is a office for a preop visit prior to planned laparoscopic sleeve gastrectomy for morbid obesity. She has successfully completed her preoperative workup. No concerns were psychologic or nutrition evaluations. Her chest x-ray and upper GI series were unremarkable. She has mild anemia but no other significant laboratory abnormalities.  Past Medical History  Diagnosis Date  . Hypertension   . H/O gestational diabetes mellitus, not currently pregnant   . Morbid obesity with BMI of 45.0-49.9, adult    Past Surgical History  Procedure Laterality Date  . Cesarean section  2006  . Cervical cerclage  12-08-13    '06,'09   Current Outpatient Prescriptions  Medication Sig Dispense Refill  . lisinopril-hydrochlorothiazide (PRINZIDE,ZESTORETIC) 20-12.5 MG per tablet Take 1 tablet by mouth every morning.        No current facility-administered medications for this visit.   No Known Allergies History  Substance Use Topics  . Smoking status: Never Smoker   . Smokeless tobacco: Not on file  . Alcohol Use: No   Exam: BP 144/100  Temp(Src) 99 F (37.2 C) (Oral)  Resp 16  Ht 5' 2" (1.575 m)  Wt 273 lb (123.832 kg)  BMI 49.92 kg/m2  LMP 12/06/2013 General: Morbidly obese but otherwise well-appearing African female Skin: No rash or infection Lungs: Clear equal breath sounds bilaterally Cardiac: Regular rate and rhythm. No edema Abdomen: Soft and nontender. No hernias or organomegaly Extremities: No edema  Assessment and plan: Ready to proceed with laparoscopic sleeve gastrectomy. We again reviewed the consent form line by line. All her questions were answered. She is sticking to her preop diet. 

## 2013-12-13 NOTE — Anesthesia Preprocedure Evaluation (Addendum)
Anesthesia Evaluation  Patient identified by MRN, date of birth, ID band Patient awake    Reviewed: Allergy & Precautions, H&P , NPO status , Patient's Chart, lab work & pertinent test results, reviewed documented beta blocker date and time   Airway Mallampati: III TM Distance: >3 FB Neck ROM: full    Dental no notable dental hx. (+) Teeth Intact, Dental Advisory Given   Pulmonary neg pulmonary ROS,  breath sounds clear to auscultation  Pulmonary exam normal       Cardiovascular Exercise Tolerance: Good hypertension, On Medications Rhythm:regular Rate:Normal     Neuro/Psych negative neurological ROS  negative psych ROS   GI/Hepatic negative GI ROS, Neg liver ROS,   Endo/Other  negative endocrine ROSMorbid obesity  Renal/GU negative Renal ROS  negative genitourinary   Musculoskeletal   Abdominal (+) + obese,   Peds  Hematology negative hematology ROS (+)   Anesthesia Other Findings   Reproductive/Obstetrics negative OB ROS                          Anesthesia Physical Anesthesia Plan  ASA: III  Anesthesia Plan: General   Post-op Pain Management:    Induction: Intravenous  Airway Management Planned: Oral ETT  Additional Equipment:   Intra-op Plan:   Post-operative Plan:   Informed Consent: I have reviewed the patients History and Physical, chart, labs and discussed the procedure including the risks, benefits and alternatives for the proposed anesthesia with the patient or authorized representative who has indicated his/her understanding and acceptance.   Dental Advisory Given  Plan Discussed with: CRNA and Surgeon  Anesthesia Plan Comments:         Anesthesia Quick Evaluation

## 2013-12-13 NOTE — Preoperative (Signed)
Beta Blockers   Reason not to administer Beta Blockers:Not Applicable, not on home BB 

## 2013-12-14 ENCOUNTER — Encounter (HOSPITAL_COMMUNITY): Payer: Self-pay | Admitting: General Surgery

## 2013-12-14 ENCOUNTER — Inpatient Hospital Stay (HOSPITAL_COMMUNITY): Payer: 59

## 2013-12-14 LAB — CBC WITH DIFFERENTIAL/PLATELET
Basophils Absolute: 0 10*3/uL (ref 0.0–0.1)
Basophils Relative: 0 % (ref 0–1)
EOS ABS: 0 10*3/uL (ref 0.0–0.7)
EOS PCT: 0 % (ref 0–5)
HEMATOCRIT: 30.9 % — AB (ref 36.0–46.0)
Hemoglobin: 10.1 g/dL — ABNORMAL LOW (ref 12.0–15.0)
LYMPHS ABS: 1.2 10*3/uL (ref 0.7–4.0)
Lymphocytes Relative: 12 % (ref 12–46)
MCH: 25.6 pg — AB (ref 26.0–34.0)
MCHC: 32.7 g/dL (ref 30.0–36.0)
MCV: 78.4 fL (ref 78.0–100.0)
MONO ABS: 0.5 10*3/uL (ref 0.1–1.0)
MONOS PCT: 4 % (ref 3–12)
Neutro Abs: 8.6 10*3/uL — ABNORMAL HIGH (ref 1.7–7.7)
Neutrophils Relative %: 84 % — ABNORMAL HIGH (ref 43–77)
PLATELETS: 360 10*3/uL (ref 150–400)
RBC: 3.94 MIL/uL (ref 3.87–5.11)
RDW: 15.5 % (ref 11.5–15.5)
WBC: 10.2 10*3/uL (ref 4.0–10.5)

## 2013-12-14 LAB — HEMOGLOBIN AND HEMATOCRIT, BLOOD
HCT: 32.3 % — ABNORMAL LOW (ref 36.0–46.0)
HEMOGLOBIN: 10.5 g/dL — AB (ref 12.0–15.0)

## 2013-12-14 MED ORDER — PROMETHAZINE HCL 25 MG/ML IJ SOLN
12.5000 mg | Freq: Four times a day (QID) | INTRAMUSCULAR | Status: DC | PRN
  Administered 2013-12-14: 12.5 mg via INTRAVENOUS
  Filled 2013-12-14: qty 1

## 2013-12-14 MED ORDER — LIP MEDEX EX OINT
TOPICAL_OINTMENT | CUTANEOUS | Status: DC | PRN
  Administered 2013-12-14: 18:00:00 via TOPICAL
  Filled 2013-12-14: qty 7

## 2013-12-14 MED ORDER — IOHEXOL 300 MG/ML  SOLN
50.0000 mL | Freq: Once | INTRAMUSCULAR | Status: AC | PRN
  Administered 2013-12-14: 30 mL via ORAL

## 2013-12-14 NOTE — Care Management Note (Signed)
    Page 1 of 1   12/14/2013     12:05:22 PM   CARE MANAGEMENT NOTE 12/14/2013  Patient:  Miranda Arroyo,Miranda Arroyo   Account Number:  [id number]  Date Initiated:  12/14/2013  Documentation initiated by:  Lorenda IshiharaPEELE,Maleiah Dula  Subjective/Objective Assessment:   33 yo female admitted s/p sleeve gastrectomy. PTA lived at home with family.     Action/Plan:   Home when stable   Anticipated DC Date:  12/14/2013   Anticipated DC Plan:  HOME/SELF CARE      DC Planning Services  CM consult      Choice offered to / List presented to:             Status of service:  Completed, signed off Medicare Important Message given?   (If response is "NO", the following Medicare IM given date fields will be blank) Date Medicare IM given:   Date Additional Medicare IM given:    Discharge Disposition:  HOME/SELF CARE  Per UR Regulation:  Reviewed for med. necessity/level of care/duration of stay  If discussed at Long Length of Stay Meetings, dates discussed:    Comments:

## 2013-12-14 NOTE — Progress Notes (Signed)
Patient ID: Miranda Arroyo, female   DOB: [birth year], 33 y.o.   MRN: [id number] 1 Day Post-Op  Subjective: Nausea is chief C/O, vomited last night.  Moderate pain controlled with meds, has been ambulatory  Objective: Vital signs in last 24 hours: Temp:  [97.4 F (36.3 C)-99.9 F (37.7 C)] 98.7 F (37.1 C) (03/17 1000) Pulse Rate:  [86-102] 90 (03/17 1000) Resp:  [18] 18 (03/17 1000) BP: (123-156)/(81-94) 142/92 mmHg (03/17 1000) SpO2:  [95 %-100 %] 95 % (03/17 1000) Last BM Date: 12/12/13  Intake/Output from previous day: 03/16 0701 - 03/17 0700 In: 2788.3 [I.V.:2788.3] Out: 1351 [Urine:1300; Emesis/NG output:1; Blood:50] Intake/Output this shift: Total I/O In: -  Out: 400 [Urine:400]  General appearance: alert, cooperative and no distress GI: abnormal findings:  mild tenderness in the upper abdomen Incision/Wound: Clean and dry  Lab Results:   Recent Labs  12/13/13 1856 12/14/13 0445  WBC  --  10.2  HGB 10.9* 10.1*  HCT 33.2* 30.9*  PLT  --  360   BMET No results found for this basename: NA, K, CL, CO2, GLUCOSE, BUN, CREATININE, CALCIUM,  in the last 72 hours   Studies/Results: Dg Ugi W/water Sol Cm  12/14/2013   CLINICAL DATA:  Status post sleeve gastrectomy.  EXAM: WATER SOLUBLE UPPER GI SERIES  TECHNIQUE: Single-column upper GI series was performed using water soluble contrast.  CONTRAST:  30mL OMNIPAQUE IOHEXOL 300 MG/ML  SOLN  COMPARISON:  06/16/2013.  FLUOROSCOPY TIME:  1 min 38 seconds.  FINDINGS: Limited fluoroscopic images of the distal esophagus and stomach demonstrate postoperative changes of sleeve gastrectomy. No evidence of extravasation of contrast. The contrast extended through the gastric sleeve well, without evidence of obstruction. Duodenal bulb was normal in appearance.  IMPRESSION: 1. Expected postoperative changes of gastric sleeve procedure, without evidence of extravasation or other acute complicating features.   Electronically Signed   By:  Trudie Reedaniel  Entrikin M.D.   On: 12/14/2013 10:06    Anti-infectives: Anti-infectives   Start     Dose/Rate Route Frequency Ordered Stop   12/13/13 0530  cefOXitin (MEFOXIN) 2 g in dextrose 5 % 50 mL IVPB     2 g 100 mL/hr over 30 Minutes Intravenous On call to O.R. 12/13/13 0530 12/13/13 0728      Assessment/Plan: s/p Procedure(s): LAPAROSCOPIC GASTRIC SLEEVE RESECTION, upper endoscopy Stable post op Nausea but no apparent complications Start H2O when able    LOS: 1 day    Omkar Stratmann T 12/14/2013

## 2013-12-14 NOTE — Progress Notes (Signed)
Patient wanted ordered for shower, spoke with MD Hoxworth and ordered given, entered Stanford BreedBracey, Monita Swier N RN 12-14-2013 16:14pm

## 2013-12-14 NOTE — Progress Notes (Signed)
Patient is alert and oriented. Vital signs are stable. Patient ambulated in hall way around the nursing station once and complained of nausea afterwards. Phenergan 12.725ml was given and it relieved the nausea. Patient is on 2 ounces of water by mouth every 4 hours. Patient still needs to be encouraged to use incentive spirometer. Patient has faint bowel sounds and no flatus. Venita LickEsther Aalaiyah Yassin (Student nurse). 12/14/2013. 1950

## 2013-12-15 LAB — CBC WITH DIFFERENTIAL/PLATELET
Basophils Absolute: 0 10*3/uL (ref 0.0–0.1)
Basophils Relative: 0 % (ref 0–1)
EOS PCT: 0 % (ref 0–5)
Eosinophils Absolute: 0 10*3/uL (ref 0.0–0.7)
HEMATOCRIT: 33.8 % — AB (ref 36.0–46.0)
Hemoglobin: 10.7 g/dL — ABNORMAL LOW (ref 12.0–15.0)
LYMPHS PCT: 24 % (ref 12–46)
Lymphs Abs: 2 10*3/uL (ref 0.7–4.0)
MCH: 25.5 pg — ABNORMAL LOW (ref 26.0–34.0)
MCHC: 31.7 g/dL (ref 30.0–36.0)
MCV: 80.7 fL (ref 78.0–100.0)
MONO ABS: 0.4 10*3/uL (ref 0.1–1.0)
Monocytes Relative: 5 % (ref 3–12)
NEUTROS ABS: 6 10*3/uL (ref 1.7–7.7)
Neutrophils Relative %: 71 % (ref 43–77)
Platelets: 375 10*3/uL (ref 150–400)
RBC: 4.19 MIL/uL (ref 3.87–5.11)
RDW: 15.6 % — ABNORMAL HIGH (ref 11.5–15.5)
WBC: 8.4 10*3/uL (ref 4.0–10.5)

## 2013-12-15 MED ORDER — OXYCODONE HCL 5 MG/5ML PO SOLN: 5.0000 mg | ORAL | Status: DC | PRN

## 2013-12-15 NOTE — Discharge Summary (Signed)
   Patient ID: Miranda Arroyo [id number] 32 y.o. 12/15/1980  12/13/2013  Discharge date and time: 12/15/2013   Admitting Physician: Glenna FellowsHOXWORTH,Britani Beattie T  Discharge Physician: Glenna FellowsHOXWORTH,Cache Bills T  Admission Diagnoses: morbid obesity   Discharge Diagnoses: same  Operations: Procedure(s): LAPAROSCOPIC GASTRIC SLEEVE RESECTION, upper endoscopy  Admission Condition: good  Discharged Condition: good  Indication for Admission: patient is a 33 year old female with progressive morbid obesity unresponsive to medical management he presents with a BMI of 49 and comorbidities of hypertension. After an extensive preoperative workup and discussion detailed elsewhere we elect to proceed with laparoscopic sleeve gastrectomy for treatment of her morbid obesity.  Hospital Course: patient was admitted on the morning of her procedure and underwent an unremarkable laparoscopic sleeve gastrectomy. The following morning she had quite a lot of nausea which was positional. Vital signs his CBC were stable. Gastrografin swallow showed no leak or obstruction. By later in the day her nausea was resolving and she was able to tolerate water and ice chips. On the second postoperative day her nausea is completely resolved. She is ambulatory with just mild soreness. Tolerating protein shakes well. Abdomen is soft and nontender. Wounds healing well. Hemoglobin is stable and white blood count normal. She is ready for discharge.   Disposition: Home  Patient Instructions:    Medication List         lisinopril-hydrochlorothiazide 20-12.5 MG per tablet  Commonly known as:  PRINZIDE,ZESTORETIC  Take 1 tablet by mouth every morning.     oxyCODONE 5 MG/5ML solution  Commonly known as:  ROXICODONE  Take 5-10 mLs (5-10 mg total) by mouth every 4 (four) hours as needed for moderate pain or severe pain.        Activity: activity as tolerated Diet: protein shakes Wound Care: none needed  Follow-up:  With Dr. Johna SheriffHoxworth  in 3 weeks.  Signed: Mariella SaaBenjamin T Ilanna Deihl MD, FACS  12/15/2013, 8:17 AM

## 2013-12-15 NOTE — Progress Notes (Signed)
Nutrition Education Consult Note - per DROP initiative   Diet: First 2 Weeks  You will see the nutritionist about two (2) weeks after your surgery. The nutritionist will increase the types of foods you can eat if you are handling liquids well:  If you have severe vomiting or nausea and cannot handle clear liquids lasting longer than 1 day, call your surgeon   Protein Shake  Drink at least 2 ounces of shake 5-6 times per day  Each serving of protein shakes (usually 8 - 12 ounces) should have a minimum of:  15 grams of protein  And no more than 5 grams of carbohydrate  Goal for protein each day:  Men = 80 grams per day  Women = 60 grams per day  Protein powder may be added to fluids such as non-fat milk or Lactaid milk or Soy milk (limit to 35 grams added protein powder per serving)   Hydration  Slowly increase the amount of water and other clear liquids as tolerated (See Acceptable Fluids)  Slowly increase the amount of protein shake as tolerated  Sip fluids slowly and throughout the day  May use sugar substitutes in small amounts (no more than 6 - 8 packets per day; i.e. Splenda)   Fluid Goal  The first goal is to drink at least 8 ounces of protein shake/drink per day (or as directed by the nutritionist); some examples of protein shakes are [employer] Nectar, Dillard'sdkins Advantage, EAS Edge HP, and Unjury. See handout from pre-op Bariatric Education Class:  Slowly increase the amount of protein shake you drink as tolerated  You may find it easier to slowly sip shakes throughout the day  It is important to get your proteins in first  Your fluid goal is to drink 64 - 100 ounces of fluid daily  It may take a few weeks to build up to this  32 oz (or more) should be clear liquids  And  32 oz (or more) should be full liquids (see below for examples)  Liquids should not contain sugar, caffeine, or carbonation   Clear Liquids:  Water or Sugar-free flavored water (i.e. Fruit H2O, Propel)   Decaffeinated coffee or tea (sugar-free)  Crystal Lite, Wyler's Lite, Minute Maid Lite  Sugar-free Jell-O  Bouillon or broth  Sugar-free Popsicle: *Less than 20 calories each; Limit 1 per day   Full Liquids:  Protein Shakes/Drinks + 2 choices per day of other full liquids  Full liquids must be:  No More Than 12 grams of Carbs per serving  No More Than 3 grams of Fat per serving  Strained low-fat cream soup  Non-Fat milk  Fat-free Lactaid Milk  Sugar-free yogurt (Dannon Lite & Fit, Greek yogurt)   Reviewed 2 week post-op diet plan with pt. Pt already has Atkins shakes and multivitamins at home. Only c/o is slight cramping after drinking some water this morning. Educated pt on importance of diet adherence until meeting with outpatient RD follow up in 2 weeks for further diet progession. Pt expressed understanding, expect good compliance.   Levon HedgerHeather Baron MS, RD, LDN [phone] Pager [phone] After Hours Pager

## 2013-12-15 NOTE — Progress Notes (Signed)
Patient verbalized understanding of discharge instructions. Patient's assessment has not changed from am. Patient is stable at discharge. Patient was transported to lobby by staff at discharge.

## 2013-12-20 ENCOUNTER — Telehealth (HOSPITAL_COMMUNITY): Payer: Self-pay

## 2013-12-20 NOTE — Telephone Encounter (Signed)
Made discharge phone call to patient per DROP protocol. Asking the following questions.    1. Do you have someone to care for you now that you are home?  yes 2. Are you having pain now that is not relieved by your pain medication?  no 3. Are you able to drink the recommended daily amount of fluids (48 ounces minimum/day) and protein (60-80 grams/day) as prescribed by the dietitian or nutritional counselor?   yes 4. Are you taking the vitamins and minerals as prescribed?  yes 5. Do you have the "on call" number to contact your surgeon if you have a problem or question?  yes 6. Are your incisions free of redness, swelling or drainage? (If steri strips, address that these can fall off, shower as tolerated) yes 7. Have your bowels moved since your surgery?  If not, are you passing gas?   no, yes 8. Are you up and walking 3-4 times per day?  yes 9. Do you have an appointment to see a dietitian or nutritional counselor in the next month?  yes  The following questions can be added at the center's discharge phone call script at the center's discretion.  The questions are also captured within [employer]. project custom fields. 1. Do you have an appointment made to see your surgeon in the next month?  yes 2. Were you provided your discharge medications before your surgery or before you were discharged from the hospital and are you taking them without problem?  yes 3. Were you provided phone numbers to the clinic/surgeon's office?  yes 4. Did you watch the patient education video module in the (clinic, surgeon's office, etc.) before your surgery? no 5. Do you have a discharge checklist that was provided to you in the hospital to reference with instructions on how to take care of yourself after surgery?  yes 6. Did you see a dietitian or nutritional counselor while you were in the hospital?  yes 7.

## 2013-12-20 NOTE — Progress Notes (Signed)
Late Entry  Patient alert and oriented, pain is controlled. Patient is tolerating fluids, plan to advance to protein shake today. Reviewed Gastric Sleeve discharge instructions with patient and patient is able to articulate understanding. Provided information on BELT program, Support Group and [facility] outpatient pharmacy. All questions answered, will continue to monitor.

## 2013-12-20 NOTE — Discharge Instructions (Signed)
Per Bariatric nurse      GASTRIC BYPASS/SLEEVE  Home Care Instructions   These instructions are to help you care for yourself when you go home.  Call: If you have any problems.   Call [phone] and ask for the surgeon on call   If you need immediate assistance [facility]. Tell the ER staff you are a new post-op gastric bypass or gastric sleeve patient  Signs and symptoms to report:   Severe  vomiting or nausea o If you cannot handle clear liquids for longer than 1 day, call your surgeon   Abdominal pain which does not get better after taking your pain medication   Fever greater than 100.4  F and chills   Heart rate over 100 beats a minute   Trouble breathing   Chest pain   Redness,  swelling, drainage, or foul odor at incision (surgical) sites   If your incisions open or pull apart   Swelling or pain in calf (lower leg)   Diarrhea (Loose bowel movements that happen often), frequent watery, uncontrolled bowel movements   Constipation, (no bowel movements for 3 days) if this happens: o Take Milk of Magnesia, 2 tablespoons by mouth, 3 times a day for 2 days if needed o Stop taking Milk of Magnesia once you have had a bowel movement o Call your doctor if constipation continues Or o Take Miralax  (instead of Milk of Magnesia) following the label instructions o Stop taking Miralax once you have had a bowel movement o Call your doctor if constipation continues   Anything you think is abnormal for you   Normal side effects after surgery:   Unable to sleep at night or unable to concentrate   Irritability   Being tearful (crying) or depressed  These are common complaints, possibly related to your anesthesia, stress of surgery, and change in lifestyle, that usually go away a few weeks after surgery. If these feelings continue, call your medical doctor.  Wound Care: You may have surgical glue, steri-strips, or staples over your incisions after surgery   Surgical  glue: Looks like clear film over your incisions and will wear off a little at a time   Steri-strips: Adhesive strips of tape over your incisions. You may notice a yellowish color on skin under the steri-strips. This is used to make the steri-strips stick better. Do not pull the steri-strips off - let them fall off   Staples: Staples may be removed before you leave the hospital o If you go home with staples, call [facility] for an appointment with your surgeons nurse to have staples removed 10 days after surgery, [phone]   Showering: You may shower two (2) days after your surgery unless your surgeon tells you differently o Wash gently around incisions with warm soapy water, rinse well, and gently pat dry o If you have a drain (tube from your incision), you may need someone to hold this while you shower o No tub baths until staples are removed and incisions are healed   Medications:   Medications should be liquid or crushed if larger than the size of a dime   Extended release pills (medication that releases a little bit at a time through the  day) should not be crushed   Depending on the size and number of medications you take, you may need to space (take a few throughout the day)/change the time you take your medications so that you do  life -style changes °• If you have diabetes, follow up with your doctor that orders your diabetes medication(s) within one week after surgery and check your blood sugar regularly ° °• Do not drive while taking narcotics (pain medications) ° °• Do not take acetaminophen (Tylenol) and Roxicet or Lortab Elixir at the same time since these pain medications contain acetaminophen °  °Diet:  °First 2 Weeks You will see the nutritionist about two (2) weeks after your surgery. The nutritionist will  increase the types of foods you can eat if you are handling liquids well: °• If you have severe vomiting or nausea and cannot handle clear liquids lasting longer than 1 day call your surgeon °Protein Shake °• Drink at least 2 ounces of shake 5-6 times per day °• Each serving of protein shakes (usually 8-12 ounces) should have a minimum of: °o 15 grams of protein °o And no more than 5 grams of carbohydrate °• Goal for protein each day: °o Men = 80 grams per day °o Women = 60 grams per day °  ° • Protein powder may be added to fluids such as non-fat milk or Lactaid milk or Soy milk (limit to 35 grams added protein powder per serving) ° °Hydration °• Slowly increase the amount of water and other clear liquids as tolerated (See Acceptable Fluids) °• Slowly increase the amount of protein shake as tolerated °• Sip fluids slowly and throughout the day °• May use sugar substitutes in small amounts (no more than 6-8 packets per day; i.e. Splenda) ° °Fluid Goal °• The first goal is to drink at least 8 ounces of protein shake/drink per day (or as directed by the nutritionist); some examples of protein shakes are Syntrax Nectar, Adkins Advantage, EAS Edge HP, and Unjury. - See handout from pre-op Bariatric Education Class: °o Slowly increase the amount of protein shake you drink as tolerated °o You may find it easier to slowly sip shakes throughout the day °o It is important to get your proteins in first °• Your fluid goal is to drink 64-100 ounces of fluid daily °o It may take a few weeks to build up to this  °• 32 oz. (or more) should be clear liquids °And °• 32 oz. (or more) should be full liquids (see below for examples) °• Liquids should not contain sugar, caffeine, or carbonation ° °Clear Liquids: °• Water of Sugar-free flavored water (i.e. Fruit H²O, Propel) °• Decaffeinated coffee or tea (sugar-free) °• Crystal lite, Wyler’s Lite, Minute Maid Lite °• Sugar-free Jell-O °• Bouillon or broth °• Sugar-free Popsicle:    -  Less than 20 calories each; Limit 1 per day ° °Full Liquids: °                  Protein Shakes/Drinks + 2 choices per day of other full liquids °• Full liquids must be: °o No More Than 12 grams of Carbs per serving °o No More Than 3 grams of Fat per serving °• Strained low-fat cream soup °• Non-Fat milk °• Fat-free Lactaid Milk °• Sugar-free yogurt (Dannon Lite & Fit, Greek yogurt) ° °  °Vitamins and Minerals • Start 1 day after surgery unless otherwise directed by your surgeon °• 2 Chewable Multivitamin / Multimineral Supplement with iron (i.e. Centrum for Adults) °• Vitamin B-12, 350-500 micrograms sub-lingual (place tablet under the tongue) each day °• Chewable Calcium Citrate with Vitamin D-3 °(Example: 3 Chewable Calcium  Plus 600 with Vitamin D-3) °o Take 500 mg three (3)   times a day for a total of 1500 mg each day °o Do not take all 3 doses of calcium at one time as it may cause constipation, and you can only absorb 500 mg at a time °o Do not mix multivitamins containing iron with calcium supplements;  take 2 hours apart °o Do not substitute Tums (calcium carbonate) for your calcium °• Menstruating women and those at risk for anemia ( a blood disease that causes weakness) may need extra iron °o Talk to your doctor to see if you need more iron °• If you need extra iron: Total daily Iron recommendation (including Vitamins) is 50 to 100 mg Iron/day °• Do not stop taking or change any vitamins or minerals until you talk to your nutritionist or surgeon °• Your nutritionist and/or surgeon must approve all vitamin and mineral supplements °  °Activity and Exercise: It is important to continue walking at home. Limit your physical activity as instructed by your doctor. During this time, use these guidelines: °• Do not lift anything greater than ten  (10) pounds for at least two (2) weeks °• Do not go back to work or drive until your surgeon says you can °• You may have sex when you feel comfortable °o It is VERY  important for female patients to use a reliable birth control method; fertility often increase after surgery °o Do not get pregnant for at least 18 months °• Start exercising as soon as your doctor tells you that you can °o Make sure your doctor approves any physical activity °• Start with a simple walking program °• Walk 5-15 minutes each day, 7 days per week °• Slowly increase until you are walking 30-45 minutes per day °• Consider joining our BELT program. [phone] or email [email or web address] °  °Special Instructions Things to remember: °• Free counseling is available for you and your family through collaboration between [facility] and [employer]. Please call [phone] and leave a message °• Use your CPAP when sleeping if this applies to you °• Consider buying a medical alert bracelet that says you had lap-band surgery °  °  You will likely have your first fill (fluid added to your band) 6 - 8 weeks after surgery °• [facility] has a free Bariatric Surgery Support Group that meets monthly, the 3rd Thursday, 6pm. [facility] Classrooms. You can see classes online at [email or web address] °• It is very important to keep all follow up appointments with your surgeon, nutritionist, primary care physician, and behavioral health practitioner °o After the first year, please follow up with your bariatric surgeon and nutritionist at least once a year in order to maintain best weight loss results °      °             [facility]:  [phone] ° °             [facility]: [phone] ° °             Bariatric Nurse Coordinator: [phone]  °Gastric Bypass/Sleeve Home Care Instructions  Rev. 10/2012    ° °                                                    Reviewed and Endorsed °                                                     by [facility] Patient Education Committee, Jan, 2014 ° ° ° ° ° ° ° ° ° °

## 2013-12-28 ENCOUNTER — Encounter: Payer: 59 | Attending: General Surgery

## 2013-12-28 DIAGNOSIS — Z713 Dietary counseling and surveillance: Secondary | ICD-10-CM | POA: Insufficient documentation

## 2013-12-28 DIAGNOSIS — Z01818 Encounter for other preprocedural examination: Secondary | ICD-10-CM | POA: Insufficient documentation

## 2013-12-28 DIAGNOSIS — E669 Obesity, unspecified: Secondary | ICD-10-CM | POA: Insufficient documentation

## 2013-12-29 ENCOUNTER — Ambulatory Visit (INDEPENDENT_AMBULATORY_CARE_PROVIDER_SITE_OTHER): Payer: 59 | Admitting: General Surgery

## 2013-12-29 ENCOUNTER — Encounter (INDEPENDENT_AMBULATORY_CARE_PROVIDER_SITE_OTHER): Payer: Self-pay | Admitting: General Surgery

## 2013-12-29 NOTE — Progress Notes (Signed)
History: Patient returns for her first postoperative visit just over 2 weeks following a sleeve gastrectomy. She is getting along extremely well. Tolerating full liquids without difficulty. She is transitioning just this week for solid foods and having a little bit of nausea and one episode of vomiting with those. Energy level is good and she is walking up to an hour on the treadmill. No double pain or fever.  Exam: BP 122/82  Pulse 78  Temp(Src) 97.2 F (36.2 C) (Temporal)  Resp 16  Ht 5\' 2"  (1.575 m)  Wt 257 lb 6.4 oz (116.756 kg)  BMI 47.07 kg/m2  LMP 12/06/2013 Weight loss 16 pounds since surgery General: Appears well and in no distress Abdomen: Soft and nontender. Wounds healing well.  Assessment and plan: Doing very well following sleeve gastrectomy without apparent complication. I told her to concentrate on liquids and not forced to solids at this point. She will return in one month.

## 2014-01-04 ENCOUNTER — Encounter: Payer: 59 | Attending: General Surgery

## 2014-01-04 DIAGNOSIS — E669 Obesity, unspecified: Secondary | ICD-10-CM | POA: Insufficient documentation

## 2014-01-04 DIAGNOSIS — Z01818 Encounter for other preprocedural examination: Secondary | ICD-10-CM | POA: Insufficient documentation

## 2014-01-04 DIAGNOSIS — Z713 Dietary counseling and surveillance: Secondary | ICD-10-CM | POA: Insufficient documentation

## 2014-01-04 NOTE — Patient Instructions (Signed)
Patient to follow Phase 3A-Soft, High Protein Diet and follow-up at [facility] in 6 weeks for 2 months post-op nutrition visit for diet advancement. 

## 2014-01-04 NOTE — Progress Notes (Signed)
  Bariatric Class:  Appt start time: 1530 end time:  1630.  2 Week Post-Operative Nutrition Class  Patient was seen on 01/04/2014 for Post-Operative Nutrition [facility].   Surgery date: 12/13/2013 Surgery type: Gastric sleeve Start weight at [facility]: 258 lbs on 07/09/2014 Weight today: 255.5 lbs  Weight change: 15.5 lbs Total weight loss: 2.5 lbs  TANITA  BODY COMP RESULTS  10/18/2013 01/04/14   BMI (kg/m^2) 48.8 46.0   Fat Mass (lbs) 149.5 135.5   Fat Free Mass (lbs) 121.5 120.0   Total Body Water (lbs) 89 88.0    The following the learning objectives were met by the patient during this course:  Identifies Phase 3A (Soft, High Proteins) Dietary Goals and will begin from 2 weeks post-operatively to 2 months post-operatively  Identifies appropriate sources of fluids and proteins   States protein recommendations and appropriate sources post-operatively  Identifies the need for appropriate texture modifications, mastication, and bite sizes when consuming solids  Identifies appropriate multivitamin and calcium sources post-operatively  Describes the need for physical activity post-operatively and will follow MD recommendations  States when to call healthcare provider regarding medication questions or post-operative complications  Handouts given during class include:  Phase 3A: Soft, High Protein Diet Handout  Follow-Up Plan: Patient will follow-up at [facility] in 6 weeks for 8 week post-op nutrition visit for diet advancement per MD.

## 2014-02-14 ENCOUNTER — Ambulatory Visit (INDEPENDENT_AMBULATORY_CARE_PROVIDER_SITE_OTHER): Payer: 59 | Admitting: General Surgery

## 2014-02-14 ENCOUNTER — Encounter (INDEPENDENT_AMBULATORY_CARE_PROVIDER_SITE_OTHER): Payer: Self-pay | Admitting: General Surgery

## 2014-02-14 VITALS — BP 118/70 | HR 72 | Temp 98.6°F | Resp 14 | Ht 62.0 in | Wt 243.8 lb

## 2014-02-14 DIAGNOSIS — E669 Obesity, unspecified: Secondary | ICD-10-CM

## 2014-02-14 NOTE — Progress Notes (Signed)
Chief complaint: Followup sleeve gastrectomy  History: Patient returns to the office just over 6 weeks following arthroscopic sleeve gastrectomy for morbid obesity with comorbidities hypertension. She continues to do well with no real complaints. She has not tolerate days but is eating other solid protein without difficulty. A little difficulty finding time for exercise but is walking and working up for. Denies abdominal pain vomiting or reflux  Exam: BP 118/70  Pulse 72  Temp(Src) 98.6 F (37 C)  Resp 14  Ht 5\' 2"  (1.575 m)  Wt 243 lb 12.8 oz (110.587 kg)  BMI 44.58 kg/m2 Total weight loss 30 pounds, 14 pounds since last visit  General: Appears well Abdomen: Soft and nontender. Wounds well healed no hernias  Assessment and plan: Doing well following gastric bypass without apparent complication. She is off of her antihypertensive. We reviewed that and exercise strategies. Return in 6 weeks.

## 2014-02-15 ENCOUNTER — Ambulatory Visit: Payer: 59 | Admitting: Dietician

## 2014-02-15 ENCOUNTER — Encounter: Payer: 59 | Attending: General Surgery | Admitting: Dietician

## 2014-02-15 DIAGNOSIS — Z01818 Encounter for other preprocedural examination: Secondary | ICD-10-CM | POA: Insufficient documentation

## 2014-02-15 DIAGNOSIS — Z713 Dietary counseling and surveillance: Secondary | ICD-10-CM | POA: Insufficient documentation

## 2014-02-15 DIAGNOSIS — E669 Obesity, unspecified: Secondary | ICD-10-CM | POA: Insufficient documentation

## 2014-02-15 NOTE — Progress Notes (Signed)
  Follow-up visit:  8 Weeks Post-Operative Sleeve Surgery  Medical Nutrition Therapy:  Appt start time: 1710 end time:  1740.  Primary concerns today: Post-operative Bariatric Surgery Nutrition Management. Returns with a 15 lbs weight loss. Having trouble tolerating eggs and sometimes shrimp and will sometimes vomit from it. Not meeting protein needs or fluid needs. Having 8 oz of regular coffee per day.   Surgery date: 12/13/2013  Surgery type: Gastric sleeve  [facility]: 273 lbs on 11/2013  Weight today: 240.5  lbs Weight change: 15 lbs  Total weight loss: 32.5 lbs   TANITA BODY COMP RESULTS   10/18/2013  01/04/14  01/1914  BMI (kg/m^2)  48.8  46.0  43.3  Fat Mass (lbs)  149.5  135.5  120.0  Fat Free Mass (lbs)  121.5  120.0  120.5  Total Body Water (lbs)  89  88.0  88.0    Preferred Learning Style:   No preference indicated   Learning Readiness:   Ready  24-hr recall: B (AM): coffee with powdered flavored creamer (coffemate)  Snk (AM): fruit cup L (PM): shredded chicken "insides" with salsa or inside of shrimp taco 1-2 oz (7-14 g) Snk (PM): none  D (PM): baked chicken or grilled pork chop or beans or fruit salad or chili (7-14 g) Snk (PM): none  Fluid intake: 8 oz coffee and powerade zero (8 oz) and 34-50 oz water  Estimated total protein intake: 14-28 g   Medications: none Supplementation: having trouble getting them in since she doesn't like them, discussed liquid vitamins  Using straws: No Drinking while eating: Tried it once Hair loss: No Carbonated beverages: No N/V/D/C: vomited with eggs and shrimp, having constipation and taking Miralax Dumping syndrome: No  Recent physical activity:  Walking on treadmill 3-4 x week and 45-60 minutes   Progress Towards Goal(s):  In progress.  Handouts given during visit include:  Phase 3B High Protein + Non Starchy Vegetables  Supplements given during visit include:  Jerrilyn CairoVanilla Unjury, lot # [id number], exp  11/2014  Premier Protein, lot # [id number], exp 06/2014   Nutritional Diagnosis:  [place]-3.3 Overweight/obesity related to past poor dietary habits and physical inactivity as evidenced by patient w/ recent sleeve gastrectomy surgery following dietary guidelines for continued weight loss.    Intervention:  Nutrition education/diet advancement.  Teaching Method Utilized:  Visual Auditory Hands on  Barriers to learning/adherence to lifestyle change: didn't know she shouldn't have fruit and coffee  Demonstrated degree of understanding via:  Teach Back   Monitoring/Evaluation:  Dietary intake, exercise,  and body weight. Follow up in 1 months for 3 month post-op visit.

## 2014-02-15 NOTE — Patient Instructions (Signed)
Goals:  Follow Phase 3B: High Protein + Non-Starchy Vegetables  Eat 3-6 small meals/snacks, every 3-5 hrs  Increase lean protein foods to meet 60g goal  Have 1-2 protein shakes to help meet goal  Try cheese sticks, chicken salad, tuna, lunch meat, yogurt to help get in protein  Increase fluid intake to 64oz +  Limit coffee unless you are getting in 64 oz of additional fluid  Avoid drinking 15 minutes before, during and 30 minutes after eating  Aim for >30 min of physical activity daily

## 2014-03-28 ENCOUNTER — Encounter: Payer: 59 | Attending: General Surgery | Admitting: Dietician

## 2014-03-28 DIAGNOSIS — Z01818 Encounter for other preprocedural examination: Secondary | ICD-10-CM | POA: Insufficient documentation

## 2014-03-28 DIAGNOSIS — E669 Obesity, unspecified: Secondary | ICD-10-CM | POA: Insufficient documentation

## 2014-03-28 DIAGNOSIS — Z713 Dietary counseling and surveillance: Secondary | ICD-10-CM | POA: Insufficient documentation

## 2014-03-28 NOTE — Patient Instructions (Addendum)
Goals:  Follow Phase 3B: High Protein + Non-Starchy Vegetables  Eat 3-6 small meals/snacks, every 3-5 hrs  Increase lean protein foods to meet 60g goal  Try cheese sticks, chicken salad, tuna, lunch meat, yogurt to help get in protein  Increase fluid intake to 64oz+  Limit coffee unless you are getting in 64 oz of additional fluid  Avoid drinking 15 minutes before, during and 30 minutes after eating  Find sublingual source of vitamin B12  1500 mg Calcium citrate per day (500 mg 3x a day)  TANITA BODY COMP RESULTS   10/18/2013  01/04/14  02/15/14 03/28/14  BMI (kg/m^2)  48.8  46.0  43.3 42.1  Fat Mass (lbs)  149.5  135.5  120.0 111.5  Fat Free Mass (lbs)  121.5  120.0  120.5 122.5  Total Body Water (lbs)  89  88.0  88.0 89.5

## 2014-03-28 NOTE — Progress Notes (Signed)
  Follow-up visit:  12 Weeks Post-Operative Sleeve Surgery  Medical Nutrition Therapy:  Appt start time: 1710 end time:  1740.  Primary concerns today: Miranda Arroyo returns today having lost 8.5 pounds of fat since her last visit. She is still not meeting her protein goal of 60 grams per day. However, her intake has improved since last visit. She has tried to include deli Malawiturkey and yogurt to increase protein. Miranda Arroyo reports that she occasionally has smoothies with fruit. She is only taking 2 Flintstones vitamins per day. She does not take Calcium or B12.   Surgery date: 12/13/2013  Surgery type: Gastric sleeve  [facility] lbs on 11/2013  Weight today: 234 lbs Weight change: 6.5 lbs  Total weight loss: 39 lbs   TANITA BODY COMP RESULTS   10/18/2013  01/04/14  02/15/14 03/28/14  BMI (kg/m^2)  48.8  46.0  43.3 42.1  Fat Mass (lbs)  149.5  135.5  120.0 111.5  Fat Free Mass (lbs)  121.5  120.0  120.5 122.5  Total Body Water (lbs)  89  88.0  88.0 89.5    Preferred Learning Style:   No preference indicated   Learning Readiness:   Ready  24-hr recall: B (AM): scrambled eggs with salsa and chicken and half a bottle of water (11-12g) Snk (AM):  L (PM): 2-3 oz chopped chicken and okra OR baked/grilled fish (14-21g) Snk (PM): none  D (PM): 2-3 oz grilled or baked chicken, pork chop, or fish (14-21g) Snk (PM): none  Fluid intake: unsweet tea, water, smoothies occasionally     Estimated total protein intake: 40-54 g   Medications: none Supplementation: just taking multivitamins  Using straws: No Drinking while eating: "slipped up a few times" - causes pain Hair loss: No Carbonated beverages: tried one - causes pain N/V/D/C: having constipation and taking Miralax Dumping syndrome: No  Recent physical activity:  Walking, hiking, and yardwork 3-4 x week and 45-60 minutes   Progress Towards Goal(s):  In progress.   Nutritional Diagnosis:  [place]-3.3 Overweight/obesity related to  past poor dietary habits and physical inactivity as evidenced by patient w/ recent sleeve gastrectomy surgery following dietary guidelines for continued weight loss.    Intervention:  Nutrition education/diet advancement.  Teaching Method Utilized:  Visual Auditory Hands on  Barriers to learning/adherence to lifestyle change: didn't know she shouldn't have fruit and coffee  Demonstrated degree of understanding via:  Teach Back   Monitoring/Evaluation:  Dietary intake, exercise,  and body weight. Follow up in 1 months for 4 month post-op visit.

## 2014-04-06 ENCOUNTER — Ambulatory Visit (INDEPENDENT_AMBULATORY_CARE_PROVIDER_SITE_OTHER): Payer: 59 | Admitting: General Surgery

## 2014-05-04 ENCOUNTER — Ambulatory Visit: Payer: 59 | Admitting: Dietician

## 2014-05-06 ENCOUNTER — Encounter (INDEPENDENT_AMBULATORY_CARE_PROVIDER_SITE_OTHER): Payer: Self-pay | Admitting: General Surgery

## 2014-05-06 ENCOUNTER — Ambulatory Visit (INDEPENDENT_AMBULATORY_CARE_PROVIDER_SITE_OTHER): Payer: 59 | Admitting: General Surgery

## 2014-05-06 DIAGNOSIS — K912 Postsurgical malabsorption, not elsewhere classified: Secondary | ICD-10-CM

## 2014-05-06 DIAGNOSIS — Z09 Encounter for follow-up examination after completed treatment for conditions other than malignant neoplasm: Secondary | ICD-10-CM

## 2014-05-06 NOTE — Progress Notes (Signed)
Plan: Followup sleeve gastrectomy  History: Patient returns for followup now approaching 6 months following laparoscopic sleeve gastrectomy. She is not having any particular issues. She is tolerating her protein diet without vomiting or pain. Energy level is good. Her exercise has slight cough a bit she's been very dizzy.  Exam: BP 122/80  Pulse 76  Temp(Src) 98 F (36.7 C)  Ht 5\' 2"  (1.575 m)  Wt 228 lb (103.42 kg)  BMI 41.69 kg/m2 Totally lost 45 pounds, 15 pounds from last visit General: Appears well Skin: The retroflexion Lungs: Resume increased work of breathing Abdomen: Soft and nontender. Wounds well healed. No hernias.  Assessment and plan: Doing well following sleeve gastrectomy without evidence of complication and good ongoing weight loss. We discussed diet and exercise regimen with forward. We will check CBC and vital levels today. Return in 3 months.

## 2014-07-15 ENCOUNTER — Other Ambulatory Visit: Payer: Self-pay

## 2016-05-01 MED FILL — CHLORHEXIDINE 0.12% RINSE: 0.12 | 15 days supply | Qty: 473 | Fill #0

## 2016-05-01 MED FILL — HYDROCODON-APAP 5-325: 5-325 | 5 days supply | Qty: 20 | Fill #0

## 2016-07-05 ENCOUNTER — Encounter (HOSPITAL_COMMUNITY): Payer: Self-pay

## 2017-07-04 ENCOUNTER — Inpatient Hospital Stay (HOSPITAL_COMMUNITY)
Admission: AD | Admit: 2017-07-04 | Discharge: 2017-07-04 | Disposition: A | Discharge status: Home / Self Care | Payer: 59 | Source: Ambulatory Visit | Attending: Obstetrics and Gynecology | Admitting: Obstetrics and Gynecology

## 2017-07-04 ENCOUNTER — Inpatient Hospital Stay (HOSPITAL_COMMUNITY): Payer: 59

## 2017-07-04 ENCOUNTER — Encounter (HOSPITAL_COMMUNITY): Payer: Self-pay | Admitting: *Deleted

## 2017-07-04 DIAGNOSIS — O468X1 Other antepartum hemorrhage, first trimester: Secondary | ICD-10-CM | POA: Diagnosis not present

## 2017-07-04 DIAGNOSIS — O26851 Spotting complicating pregnancy, first trimester: Secondary | ICD-10-CM

## 2017-07-04 DIAGNOSIS — Z3491 Encounter for supervision of normal pregnancy, unspecified, first trimester: Secondary | ICD-10-CM

## 2017-07-04 DIAGNOSIS — Z3A01 Less than 8 weeks gestation of pregnancy: Secondary | ICD-10-CM | POA: Diagnosis not present

## 2017-07-04 DIAGNOSIS — O418X10 Other specified disorders of amniotic fluid and membranes, first trimester, not applicable or unspecified: Secondary | ICD-10-CM | POA: Diagnosis not present

## 2017-07-04 DIAGNOSIS — O10911 Unspecified pre-existing hypertension complicating pregnancy, first trimester: Secondary | ICD-10-CM | POA: Diagnosis not present

## 2017-07-04 DIAGNOSIS — O10011 Pre-existing essential hypertension complicating pregnancy, first trimester: Secondary | ICD-10-CM | POA: Diagnosis not present

## 2017-07-04 DIAGNOSIS — O10919 Unspecified pre-existing hypertension complicating pregnancy, unspecified trimester: Secondary | ICD-10-CM

## 2017-07-04 DIAGNOSIS — O208 Other hemorrhage in early pregnancy: Secondary | ICD-10-CM | POA: Insufficient documentation

## 2017-07-04 DIAGNOSIS — O26859 Spotting complicating pregnancy, unspecified trimester: Secondary | ICD-10-CM

## 2017-07-04 LAB — URINALYSIS, ROUTINE W REFLEX MICROSCOPIC
BACTERIA UA: NONE SEEN
Bilirubin Urine: NEGATIVE
GLUCOSE, UA: NEGATIVE mg/dL
KETONES UR: NEGATIVE mg/dL
LEUKOCYTES UA: NEGATIVE
Nitrite: NEGATIVE
PH: 8 (ref 5.0–8.0)
Protein, ur: NEGATIVE mg/dL
SPECIFIC GRAVITY, URINE: 1.002 — AB (ref 1.005–1.030)

## 2017-07-04 LAB — WET PREP, GENITAL
SPERM: NONE SEEN
Trich, Wet Prep: NONE SEEN
Yeast Wet Prep HPF POC: NONE SEEN

## 2017-07-04 LAB — CBC
HEMATOCRIT: 33.9 % — AB (ref 36.0–46.0)
HEMOGLOBIN: 11.4 g/dL — AB (ref 12.0–15.0)
MCH: 28.1 pg (ref 26.0–34.0)
MCHC: 33.6 g/dL (ref 30.0–36.0)
MCV: 83.7 fL (ref 78.0–100.0)
Platelets: 346 10*3/uL (ref 150–400)
RBC: 4.05 MIL/uL (ref 3.87–5.11)
RDW: 15.8 % — ABNORMAL HIGH (ref 11.5–15.5)
WBC: 6.5 10*3/uL (ref 4.0–10.5)

## 2017-07-04 LAB — HCG, QUANTITATIVE, PREGNANCY: hCG, Beta Chain, Quant, S: 57251 m[IU]/mL — ABNORMAL HIGH (ref <5)

## 2017-07-04 LAB — ABO/RH: ABO/RH(D): B POS

## 2017-07-04 LAB — POCT PREGNANCY, URINE: Preg Test, Ur: POSITIVE — AB

## 2017-07-04 NOTE — Discharge Instructions (Signed)
Subchorionic Hematoma °A subchorionic hematoma is a gathering of blood between the outer wall of the placenta and the inner wall of the womb (uterus). The placenta is the organ that connects the fetus to the wall of the uterus. The placenta performs the feeding, breathing (oxygen to the fetus), and waste removal (excretory work) of the fetus. °Subchorionic hematoma is the most common abnormality found on a result from ultrasonography done during the first trimester or early second trimester of pregnancy. If there has been little or no vaginal bleeding, early small hematomas usually shrink on their own and do not affect your baby or pregnancy. The blood is gradually absorbed over 1-2 weeks. When bleeding starts later in pregnancy or the hematoma is larger or occurs in an older pregnant woman, the outcome may not be as good. Larger hematomas may get bigger, which increases the chances for miscarriage. Subchorionic hematoma also increases the risk of premature detachment of the placenta from the uterus, preterm (premature) labor, and stillbirth. °Follow these instructions at home: °· Stay on bed rest if your health care provider recommends this. Although bed rest will not prevent more bleeding or prevent a miscarriage, your health care provider may recommend bed rest until you are advised otherwise. °· Avoid heavy lifting (more than 10 lb [4.5 kg]), exercise, sexual intercourse, or douching as directed by your health care provider. °· Keep track of the number of pads you use each day and how soaked (saturated) they are. Write down this information. °· Do not use tampons. °· Keep all follow-up appointments as directed by your health care provider. Your health care provider may ask you to have follow-up blood tests or ultrasound tests or both. °Get help right away if: °· You have severe cramps in your stomach, back, abdomen, or pelvis. °· You have a fever. °· You pass large clots or tissue. Save any tissue for your  health care provider to look at. °· Your bleeding increases or you become lightheaded, feel weak, or have fainting episodes. °This information is not intended to replace advice given to you by your health care provider. Make sure you discuss any questions you have with your health care provider. °Document Released: 01/01/2007 Document Revised: 02/22/2016 Document Reviewed: 04/15/2013 °Elsevier Interactive Patient Education © 2017 Elsevier Inc. ° °

## 2017-07-04 NOTE — MAU Note (Addendum)
7wks.  2 episodes of spotting this morning.  Has not required changing pads.  Has established care at [facility] - Dr. Ellyn Hack.

## 2017-07-04 NOTE — MAU Provider Note (Signed)
History     CSN: [id number]  Arrival date and time: 07/04/17 0827     Chief Complaint  Patient presents with  . Vaginal Bleeding   [id number]  by LMP here with spotting this am. Saw pink spotting in her underwear. No recent IC. No abdominal pain or cramping. No vaginal discharge prior. H/o CHTN and recently stopped HCTZ.    OB History    Gravida Para Term Preterm AB Living   SAB TAB Ectopic Multiple Live Births   Past Medical History:  Diagnosis Date  . H/O gestational diabetes mellitus, not currently pregnant   . Hypertension   . Morbid obesity with BMI of 45.0-49.9, adult [facility])     Past Surgical History:  Procedure Laterality Date  . CERVICAL CERCLAGE  12-08-13   '06,'09  . CESAREAN SECTION  2006  . LAPAROSCOPIC GASTRIC SLEEVE RESECTION N/A 12/13/2013   Procedure: LAPAROSCOPIC GASTRIC SLEEVE RESECTION, upper endoscopy;  Surgeon: Mariella Saa, MD;  Location: [facility];  Service: General;  Laterality: N/A;    Family History  Problem Relation Age of Onset  . Alcohol abuse Father   . Thyroid disease Mother   . Hypertension Mother   . Alcohol abuse Mother   . Depression Mother     Social History  Substance Use Topics  . Smoking status: Never Smoker  . Smokeless tobacco: Never Used  . Alcohol use No    Allergies: No Known Allergies  Prescriptions Prior to Admission  Medication Sig Dispense Refill Last Dose  . hydrochlorothiazide (HYDRODIURIL) 12.5 MG tablet Take 12.5 mg by mouth daily.   Past Week at Unknown time  . Prenatal Vit-Fe Fumarate-FA (PRENATAL MULTIVITAMIN) TABS tablet Take 1 tablet by mouth daily at 12 noon.   07/03/2017 at Unknown time    Review of Systems  Gastrointestinal: Negative for abdominal pain, constipation, diarrhea, nausea and vomiting.  Genitourinary: Positive for vaginal bleeding.   Physical Exam   Blood pressure (!) 147/86, pulse 92, temperature 99.6 F (37.6 C), temperature source Oral, resp.  rate 20, last menstrual period 05/16/2017, SpO2 100 %.  Physical Exam  Constitutional: She is oriented to person, place, and time. She appears well-developed and well-nourished. No distress.  HENT:  Head: Normocephalic and atraumatic.  Neck: Normal range of motion.  Cardiovascular: Normal rate.   Respiratory: Effort normal.  GI: Soft. She exhibits no distension and no mass. There is no tenderness. There is no rebound and no guarding.  Genitourinary:  Genitourinary Comments: External: no lesions or erythema Vagina: rugated, pink, moist, scant brown bloody discharge Uterus: non enlarged, anteverted, non tender, no CMT Adnexae: no masses, no tenderness left, no tenderness right   Musculoskeletal: Normal range of motion.  Neurological: She is alert and oriented to person, place, and time.  Skin: Skin is warm and dry.  Psychiatric: She has a normal mood and affect.   Results for orders placed or performed during the hospital encounter of 07/04/17 (from the past 24 hour(s))  Urinalysis, Routine w reflex microscopic     Status: Abnormal   Collection Time: 07/04/17  8:40 AM  Result Value Ref Range   Color, Urine STRAW (A) YELLOW   APPearance HAZY (A) CLEAR   Specific Gravity, Urine 1.002 (L) 1.005 - 1.030   pH 8.0 5.0 - 8.0   Glucose, UA NEGATIVE NEGATIVE mg/dL   Hgb urine dipstick LARGE (A)  NEGATIVE   Bilirubin Urine NEGATIVE NEGATIVE   Ketones, ur NEGATIVE NEGATIVE mg/dL   Protein, ur NEGATIVE NEGATIVE mg/dL   Nitrite NEGATIVE NEGATIVE   Leukocytes, UA NEGATIVE NEGATIVE   RBC / HPF 0-5 0 - 5 RBC/hpf   WBC, UA 0-5 0 - 5 WBC/hpf   Bacteria, UA NONE SEEN NONE SEEN   Squamous Epithelial / LPF 0-5 (A) NONE SEEN  Pregnancy, urine POC     Status: Abnormal   Collection Time: 07/04/17  8:47 AM  Result Value Ref Range   Preg Test, Ur POSITIVE (A) NEGATIVE  Wet prep, genital     Status: Abnormal   Collection Time: 07/04/17  9:25 AM  Result Value Ref Range   Yeast Wet Prep HPF POC NONE  SEEN NONE SEEN   Trich, Wet Prep NONE SEEN NONE SEEN   Clue Cells Wet Prep HPF POC PRESENT (A) NONE SEEN   WBC, Wet Prep HPF POC FEW (A) NONE SEEN   Sperm NONE SEEN   CBC     Status: Abnormal   Collection Time: 07/04/17 10:00 AM  Result Value Ref Range   WBC 6.5 4.0 - 10.5 K/uL   RBC 4.05 3.87 - 5.11 MIL/uL   Hemoglobin 11.4 (L) 12.0 - 15.0 g/dL   HCT 81.1 (L) 91.4 - 78.2 %   MCV 83.7 78.0 - 100.0 fL   MCH 28.1 26.0 - 34.0 pg   MCHC 33.6 30.0 - 36.0 g/dL   RDW 95.6 (H) 21.3 - 08.6 %   Platelets 346 150 - 400 K/uL  ABO/Rh     Status: None (Preliminary result)   Collection Time: 07/04/17 10:01 AM  Result Value Ref Range   ABO/RH(D) B POS    US Ob Comp Less 14 Wks  Result Date: 07/04/2017 CLINICAL DATA:  Vaginal spotting EXAM: OBSTETRIC <14 WK [place] AND TRANSVAGINAL OB US TECHNIQUE: Both transabdominal and transvaginal ultrasound examinations were performed for complete evaluation of the gestation as well as the maternal uterus, adnexal regions, and pelvic cul-de-sac. Transvaginal technique was performed to assess early pregnancy. COMPARISON:  None. FINDINGS: Intrauterine gestational sac: Single Yolk sac:  Visualized. Embryo:  Visual Cardiac Activity: Visualize Heart Rate: 168  bpm CRL:  13.4  mm   7 w   4 d                  [place] EDC: 02/16/2018 Maternal uterus/adnexae: Subchorionic hemorrhage: Small Right ovary: Normal Left ovary: Normal Other :None bold Free fluid:  None IMPRESSION: 1. Single living intrauterine gestation with an estimated gestational age of [redacted] weeks and 4 days. 2. Small subchorionic hemorrhage. Electronically Signed   By: Signa Kell M.D.   On: 07/04/2017 10:23   US Ob Transvaginal  Result Date: 07/04/2017 CLINICAL DATA:  Vaginal spotting EXAM: OBSTETRIC <14 WK [place] AND TRANSVAGINAL OB US TECHNIQUE: Both transabdominal and transvaginal ultrasound examinations were performed for complete evaluation of the gestation as well as the maternal uterus, adnexal regions, and pelvic  cul-de-sac. Transvaginal technique was performed to assess early pregnancy. COMPARISON:  None. FINDINGS: Intrauterine gestational sac: Single Yolk sac:  Visualized. Embryo:  Visual Cardiac Activity: Visualize Heart Rate: 168  bpm CRL:  13.4  mm   7 w   4 d                  [place] EDC: 02/16/2018 Maternal uterus/adnexae: Subchorionic hemorrhage: Small Right ovary: Normal Left ovary: Normal Other :None bold Free fluid:  None IMPRESSION: 1. Single  living intrauterine gestation with an estimated gestational age of [redacted] weeks and 4 days. 2. Small subchorionic hemorrhage. Electronically Signed   By: Signa Kell M.D.   On: 07/04/2017 10:23    MAU Course  Procedures  MDM Labs and [place] ordered and reviewed. Viable IUP on [place] with small SCH. Discussed findings with pt,offered reassurance. Presentation, clinical findings, and plan discussed with Dr. Senaida Ores. Stable for discharge home. Recommend f/u of BP in office.   Assessment and Plan   1. Normal intrauterine pregnancy on prenatal ultrasound in first trimester   2. Spotting affecting pregnancy   3. Subchorionic hematoma in first trimester, single or unspecified fetus   4. Chronic hypertension during pregnancy, antepartum    Discharge home Follow up in OB office as scheduled SAB/bleeding precautions  Allergies as of 07/04/2017   No Known Allergies     Medication List    STOP taking these medications   hydrochlorothiazide 12.5 MG tablet Commonly known as:  HYDRODIURIL     TAKE these medications   prenatal multivitamin Tabs tablet Take 1 tablet by mouth daily at 12 noon.      Donette Larry, CNM 07/04/2017, 11:12 AM

## 2017-07-07 LAB — GC/CHLAMYDIA PROBE AMP (~~LOC~~) NOT AT ARMC
Chlamydia: NEGATIVE
Neisseria Gonorrhea: NEGATIVE

## 2017-07-15 ENCOUNTER — Encounter (HOSPITAL_COMMUNITY): Payer: Self-pay

## 2017-07-22 LAB — OB RESULTS CONSOLE GC/CHLAMYDIA
CHLAMYDIA, DNA PROBE: NEGATIVE
Gonorrhea: NEGATIVE

## 2017-07-22 LAB — OB RESULTS CONSOLE HIV ANTIBODY (ROUTINE TESTING): HIV: NONREACTIVE

## 2017-07-22 LAB — OB RESULTS CONSOLE ABO/RH: RH TYPE: POSITIVE

## 2017-07-22 LAB — OB RESULTS CONSOLE HEPATITIS B SURFACE ANTIGEN: Hepatitis B Surface Ag: NEGATIVE

## 2017-07-22 LAB — OB RESULTS CONSOLE ANTIBODY SCREEN: Antibody Screen: NEGATIVE

## 2017-07-22 LAB — OB RESULTS CONSOLE RUBELLA ANTIBODY, IGM: RUBELLA: IMMUNE

## 2017-07-22 LAB — OB RESULTS CONSOLE RPR: RPR: NONREACTIVE

## 2017-07-28 ENCOUNTER — Other Ambulatory Visit: Payer: Self-pay

## 2017-07-28 ENCOUNTER — Encounter (HOSPITAL_COMMUNITY): Payer: Self-pay

## 2017-07-28 ENCOUNTER — Encounter (HOSPITAL_COMMUNITY)
Admission: RE | Admit: 2017-07-28 | Discharge: 2017-07-28 | Disposition: A | Discharge status: Home / Self Care | Payer: 59 | Source: Ambulatory Visit | Attending: Obstetrics and Gynecology | Admitting: Obstetrics and Gynecology

## 2017-07-28 DIAGNOSIS — N883 Incompetence of cervix uteri: Secondary | ICD-10-CM | POA: Diagnosis not present

## 2017-07-28 DIAGNOSIS — Z01812 Encounter for preprocedural laboratory examination: Secondary | ICD-10-CM | POA: Insufficient documentation

## 2017-07-28 HISTORY — DX: Nausea with vomiting, unspecified: R11.2

## 2017-07-28 HISTORY — DX: Nausea with vomiting, unspecified: Z98.890

## 2017-07-28 LAB — CBC
HEMATOCRIT: 34.3 % — AB (ref 36.0–46.0)
HEMOGLOBIN: 11.7 g/dL — AB (ref 12.0–15.0)
MCH: 29 pg (ref 26.0–34.0)
MCHC: 34.1 g/dL (ref 30.0–36.0)
MCV: 85.1 fL (ref 78.0–100.0)
Platelets: 334 10*3/uL (ref 150–400)
RBC: 4.03 MIL/uL (ref 3.87–5.11)
RDW: 15.3 % (ref 11.5–15.5)
WBC: 6.4 10*3/uL (ref 4.0–10.5)

## 2017-07-28 LAB — BASIC METABOLIC PANEL
ANION GAP: 8 (ref 5–15)
BUN: <5 mg/dL — ABNORMAL LOW (ref 6–20)
CHLORIDE: 102 mmol/L (ref 101–111)
CO2: 20 mmol/L — AB (ref 22–32)
Calcium: 8.8 mg/dL — ABNORMAL LOW (ref 8.9–10.3)
Creatinine, Ser: 0.65 mg/dL (ref 0.44–1.00)
GFR calc non Af Amer: >60 mL/min (ref >60)
GLUCOSE: 108 mg/dL — AB (ref 65–99)
POTASSIUM: 3.1 mmol/L — AB (ref 3.5–5.1)
Sodium: 130 mmol/L — ABNORMAL LOW (ref 135–145)

## 2017-07-28 LAB — TYPE AND SCREEN
ABO/RH(D): B POS
ANTIBODY SCREEN: NEGATIVE

## 2017-07-28 NOTE — Patient Instructions (Addendum)
Your procedure is scheduled on:  Friday, Nov 2  [facility]: 8 am  Pick up the phone at the desk and dial [phone].  Call this number if you have problems the morning of surgery: [phone].  Remember: Do NOT eat food or drink clear liquids (including water) after midnight Thursday  Take these medicines the morning of surgery with a SIP OF WATER:  labetalol  Do NOT wear jewelry (body piercing), metal hair clips/bobby pins, make-up, or nail polish. Do NOT wear lotions, powders, or perfumes.  You may wear deoderant. Do NOT shave for 48 hours prior to surgery. Do NOT bring valuables to the hospital.  Have a responsible adult drive you home and stay with you for 24 hours after your procedure.  Home with brother Miranda Arroyo cell [phone].

## 2017-07-31 NOTE — H&P (Signed)
Miranda Arroyo is an 36 y.o. female [phone] presenting at [redacted]weeks gestation for prophylactic cervical cerclage. 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 Hx cerclage in past x 3 with a second trimester delivery and loss of pregnancy; other two deliveries were at 1633 and 34 weeks. This pregnancy is with a different FOB. Pt has cHTN - managed in past on HCTZ but now on labetalol.  Pertinent Gynecological History: Menses: pregnant Bleeding: none Previous OB Procedures: cerclage x 3  OB History: G6, [id number]   Menstrual History: Menarche age: [age] Patient's last menstrual period was 05/16/2017 (exact date).    Past Medical History:  Diagnosis Date  . H/O gestational diabetes mellitus, not currently pregnant    with pregnancy only  . Hypertension   . Morbid obesity with BMI of 45.0-49.9, adult ([facility])   . PONV (postoperative nausea and vomiting)    with gastric sleeve surgery only - no other problems with other surgery  . SVD (spontaneous vaginal delivery) 2005,2009   x 2 -  2005 svd at 24 wks fetal demise    Past Surgical History:  Procedure Laterality Date  . CERVICAL CERCLAGE  12-08-13   '06,'09  . CESAREAN SECTION  2006  . LAPAROSCOPIC GASTRIC SLEEVE RESECTION N/A 12/13/2013   Procedure: LAPAROSCOPIC GASTRIC SLEEVE RESECTION, upper endoscopy;  Surgeon: Mariella SaaBenjamin T Hoxworth, MD;  Location: [facility];  Service: General;  Laterality: N/A;  . WISDOM TOOTH EXTRACTION      Family History  Problem Relation Age of Onset  . Alcohol abuse Father   . Thyroid disease Mother   . Hypertension Mother   . Alcohol abuse Mother   . Depression Mother     Social History:  reports that she has never smoked. She has never used smokeless tobacco. She reports that she does not drink alcohol or use drugs.  Allergies: No Known Allergies  No prescriptions prior to admission.    Review of Systems  Constitutional: Negative for chills, fever, malaise/fatigue and weight loss.  Eyes:  Negative for blurred vision.  Respiratory: Negative for shortness of breath.   Cardiovascular: Negative for chest pain.  Gastrointestinal: Negative for abdominal pain, heartburn, nausea and vomiting.  Genitourinary: Negative for dysuria.  Musculoskeletal: Negative for myalgias.  Skin: Negative for itching and rash.  Neurological: Negative for dizziness and headaches.  Endo/Heme/Allergies: Does not bruise/bleed easily.  Psychiatric/Behavioral: Negative for depression, hallucinations, substance abuse and suicidal ideas. The patient is not nervous/anxious.     Last menstrual period 05/16/2017. Physical Exam  Constitutional: She is oriented to person, place, and time. She appears well-developed and well-nourished.  Neck: Normal range of motion.  Cardiovascular: Normal rate.   Respiratory: Effort normal.  GI: Soft.  Genitourinary: Vagina normal and uterus normal.  Genitourinary Comments: C/W gestational age  Musculoskeletal: Normal range of motion.  Neurological: She is alert and oriented to person, place, and time.  Skin: Skin is warm.  Psychiatric: She has a normal mood and affect. Her behavior is normal. Judgment and thought content normal.    No results found for this or any previous visit (from the past 24 hour(s)).  No results found.  Assessment/Plan: [id number] presenting at 11wks 1/7days for prophylactic cervical cerclage FHTs will be confirmed prior to and after surgery R/B/A to surgery have been reviewed and all questions addressed To OR when ready  Janean SarkCecilia W [place] Crance 07/31/2017, 8:30 AM

## 2017-08-01 ENCOUNTER — Encounter (HOSPITAL_COMMUNITY)
Admission: RE | Disposition: A | Discharge status: Home / Self Care | Payer: Self-pay | Source: Ambulatory Visit | Attending: Obstetrics and Gynecology

## 2017-08-01 ENCOUNTER — Ambulatory Visit (HOSPITAL_COMMUNITY)
Admission: RE | Admit: 2017-08-01 | Discharge: 2017-08-01 | Disposition: A | Discharge status: Home / Self Care | Payer: 59 | Source: Ambulatory Visit | Attending: Obstetrics and Gynecology | Admitting: Obstetrics and Gynecology

## 2017-08-01 ENCOUNTER — Ambulatory Visit (HOSPITAL_COMMUNITY): Payer: 59 | Admitting: Anesthesiology

## 2017-08-01 ENCOUNTER — Encounter (HOSPITAL_COMMUNITY): Payer: Self-pay | Admitting: Anesthesiology

## 2017-08-01 DIAGNOSIS — O99211 Obesity complicating pregnancy, first trimester: Secondary | ICD-10-CM | POA: Insufficient documentation

## 2017-08-01 DIAGNOSIS — O161 Unspecified maternal hypertension, first trimester: Secondary | ICD-10-CM | POA: Insufficient documentation

## 2017-08-01 DIAGNOSIS — Z3A11 11 weeks gestation of pregnancy: Secondary | ICD-10-CM | POA: Insufficient documentation

## 2017-08-01 DIAGNOSIS — O3431 Maternal care for cervical incompetence, first trimester: Secondary | ICD-10-CM | POA: Insufficient documentation

## 2017-08-01 DIAGNOSIS — O09299 Supervision of pregnancy with other poor reproductive or obstetric history, unspecified trimester: Secondary | ICD-10-CM

## 2017-08-01 DIAGNOSIS — O3430 Maternal care for cervical incompetence, unspecified trimester: Secondary | ICD-10-CM

## 2017-08-01 HISTORY — PX: CERVICAL CERCLAGE: SHX1329

## 2017-08-01 LAB — TYPE AND SCREEN
ABO/RH(D): B POS
Antibody Screen: NEGATIVE

## 2017-08-01 SURGERY — CERCLAGE, CERVIX, VAGINAL APPROACH
Anesthesia: Spinal

## 2017-08-01 MED ORDER — OXYCODONE-ACETAMINOPHEN 5-325 MG PO TABS: 1.0000 | ORAL_TABLET | ORAL | Status: DC | PRN

## 2017-08-01 MED ORDER — ONDANSETRON HCL 4 MG/2ML IJ SOLN: 4.0000 mg | Freq: Four times a day (QID) | INTRAMUSCULAR | Status: DC | PRN

## 2017-08-01 MED ORDER — LACTATED RINGERS IV SOLN
INTRAVENOUS | Status: DC
  Administered 2017-08-01 (×2): via INTRAVENOUS

## 2017-08-01 MED ORDER — LIDOCAINE-EPINEPHRINE 1 %-1:100000 IJ SOLN
INTRAMUSCULAR | Status: AC
  Filled 2017-08-01: qty 1

## 2017-08-01 MED ORDER — ACETAMINOPHEN-CODEINE 300-30 MG PO TABS: 1.0000 | ORAL_TABLET | ORAL | 0 refills | Status: DC | PRN

## 2017-08-01 MED ORDER — ACETAMINOPHEN 500 MG PO TABS: 1000.0000 mg | ORAL_TABLET | Freq: Four times a day (QID) | ORAL | Status: DC | PRN

## 2017-08-01 MED ORDER — LIDOCAINE-EPINEPHRINE 1 %-1:100000 IJ SOLN
INTRAMUSCULAR | Status: DC | PRN
  Administered 2017-08-01: 10 mL

## 2017-08-01 MED ORDER — SODIUM CHLORIDE 0.9 % IV SOLN
1.5000 g | INTRAVENOUS | Status: AC
  Administered 2017-08-01: 1.5 g via INTRAVENOUS
  Filled 2017-08-01: qty 1.5

## 2017-08-01 MED ORDER — KETOROLAC TROMETHAMINE 30 MG/ML IJ SOLN
INTRAMUSCULAR | Status: AC
  Filled 2017-08-01: qty 1

## 2017-08-01 MED ORDER — FENTANYL CITRATE (PF) 100 MCG/2ML IJ SOLN: 25.0000 ug | INTRAMUSCULAR | Status: DC | PRN

## 2017-08-01 MED ORDER — KETOROLAC TROMETHAMINE 30 MG/ML IJ SOLN
30.0000 mg | Freq: Once | INTRAMUSCULAR | Status: AC
  Administered 2017-08-01: 30 mg via INTRAVENOUS

## 2017-08-01 MED ORDER — BUPIVACAINE IN DEXTROSE 0.75-8.25 % IT SOLN
INTRATHECAL | Status: AC
  Filled 2017-08-01: qty 2

## 2017-08-01 MED ORDER — ONDANSETRON HCL 4 MG PO TABS: 4.0000 mg | ORAL_TABLET | Freq: Four times a day (QID) | ORAL | Status: DC | PRN

## 2017-08-01 MED ORDER — ACETAMINOPHEN 500 MG PO TABS: 500.0000 mg | ORAL_TABLET | Freq: Four times a day (QID) | ORAL | 1 refills | Status: DC | PRN

## 2017-08-01 SURGICAL SUPPLY — 18 items
CANISTER SUCT 3000ML PPV (MISCELLANEOUS) ×3 IMPLANT
GLOVE BIO SURGEON STRL SZ 6.5 (GLOVE) ×4 IMPLANT
GLOVE BIO SURGEONS STRL SZ 6.5 (GLOVE) ×2
GLOVE BIOGEL PI IND STRL 7.0 (GLOVE) ×1 IMPLANT
GLOVE BIOGEL PI INDICATOR 7.0 (GLOVE) ×2
GOWN STRL REUS W/TWL LRG LVL3 (GOWN DISPOSABLE) ×6 IMPLANT
NDL MAYO CATGUT SZ4 TPR NDL (NEEDLE) ×1 IMPLANT
NEEDLE MAYO CATGUT SZ4 (NEEDLE) ×3 IMPLANT
PACK VAGINAL MINOR WOMEN LF (CUSTOM PROCEDURE TRAY) ×3 IMPLANT
PAD OB MATERNITY 4.3X12.25 (PERSONAL CARE ITEMS) ×3 IMPLANT
PAD PREP 24X48 CUFFED NSTRL (MISCELLANEOUS) ×3 IMPLANT
SUT POLYDEK 5 CE 75 36 (SUTURE) ×6 IMPLANT
SYR BULB IRRIGATION 50ML (SYRINGE) ×3 IMPLANT
TOWEL OR 17X24 6PK STRL BLUE (TOWEL DISPOSABLE) ×6 IMPLANT
TRAY FOLEY CATH SILVER 14FR (SET/KITS/TRAYS/PACK) ×3 IMPLANT
TUBING NON-CON 1/4 X 20 CONN (TUBING) ×2 IMPLANT
TUBING NON-CON 1/4 X 20' CONN (TUBING) ×1
YANKAUER SUCT BULB TIP NO VENT (SUCTIONS) ×3 IMPLANT

## 2017-08-01 NOTE — Anesthesia Preprocedure Evaluation (Signed)
Anesthesia Evaluation  Patient identified by MRN, date of birth, ID band Patient awake    Reviewed: Allergy & Precautions, H&P , Patient's Chart, lab work & pertinent test results, reviewed documented beta blocker date and time   Airway Mallampati: II  TM Distance: >3 FB Neck ROM: full    Dental no notable dental hx.    Pulmonary    Pulmonary exam normal breath sounds clear to auscultation       Cardiovascular hypertension,  Rhythm:regular Rate:Normal     Neuro/Psych    GI/Hepatic   Endo/Other  Morbid obesity  Renal/GU      Musculoskeletal   Abdominal   Peds  Hematology   Anesthesia Other Findings   Reproductive/Obstetrics                             Anesthesia Physical Anesthesia Plan  ASA: III  Anesthesia Plan: Spinal   Post-op Pain Management:    Induction:   PONV Risk Score and Plan: 1 and Ondansetron, Dexamethasone and Treatment may vary due to age or medical condition  Airway Management Planned:   Additional Equipment:   Intra-op Plan:   Post-operative Plan:   Informed Consent: I have reviewed the patients History and Physical, chart, labs and discussed the procedure including the risks, benefits and alternatives for the proposed anesthesia with the patient or authorized representative who has indicated his/her understanding and acceptance.   Dental Advisory Given  Plan Discussed with: CRNA and Surgeon  Anesthesia Plan Comments: (  )        Anesthesia Quick Evaluation

## 2017-08-01 NOTE — OR Nursing (Signed)
Dr Mindi SlickerBanga completed U/S in the operating room prior to starting of procedure.

## 2017-08-01 NOTE — Transfer of Care (Signed)
Immediate Anesthesia Transfer of Care Note  Patient: Miranda Arroyo  Procedure(s) Performed: CERCLAGE CERVICAL (N/A )  Patient Location: PACU  Anesthesia Type:Spinal  Level of Consciousness: awake, alert  and oriented  Airway & Oxygen Therapy: Patient Spontanous Breathing  Post-op Assessment: Report given to RN and Post -op Vital signs reviewed and stable  Post vital signs: Reviewed and stable  Last Vitals:  Vitals:   08/01/17 0751 08/01/17 0755  BP: (!) 162/109 116/72  Pulse: 76   Resp: 16   Temp: 36.7 C   SpO2: 100%     Last Pain:  Vitals:   08/01/17 0751  TempSrc: Oral      Patients Stated Pain Goal: 5 (08/01/17 0751)  Complications: No apparent anesthesia complications

## 2017-08-01 NOTE — Progress Notes (Signed)
FHR 164 in SS this morning.

## 2017-08-01 NOTE — Discharge Instructions (Signed)
Call if heavy bleeding or cramping uncontrolled with medication provided No intercourse till follow up visit     Cervical Cerclage, Care After This sheet gives you information about how to care for yourself after your procedure. Your health care provider may also give you more specific instructions. If you have problems or questions, contact your health care provider. What can I expect after the procedure? After your procedure, it is common to have:  Cramping in your abdomen.  Mucus discharge for several days.  Painful urination (dysuria).  Small drops of blood coming from your vagina (spotting). Follow these instructions at home:  Follow instructions from your health care provider about bed rest, if this applies. You may need to be on bed rest for up to 3 days.  Take over-the-counter and prescription medicines only as told by your health care provider.  Do not drive or use heavy machinery while taking prescription pain medicine.  Keep track of your vaginal discharge and watch for any changes. If you notice changes, tell your health care provider.  Avoid physical activities and exercise until your health care provider approves. Ask your health care provider what activities are safe for you.  Until your health care provider approves:  Do not douche.  Do not have sexual intercourse.  Keep all pre-birth (prenatal) visits and all follow-up visits as told by your health care provider. This is important. You will probably have weekly visits to have your cervix checked, and you may need an ultrasound. Contact a health care provider if:  You have abnormal or bad-smelling vaginal discharge, such as clots.  You develop a rash on your skin. This may look like redness and swelling.  You become light-headed or feel like you are going to faint.  You have abdominal pain that does not get better with medicine.  You have persistent nausea or vomiting. Get help right away if:  You  have vaginal bleeding that is heavier or more frequent than spotting.  You are leaking fluid or have a gush of fluid from your vagina (your water breaks).  You have a fever or chills.  You faint.  You have uterine contractions. These may feel like:  A back ache.  Lower abdominal pain.  Mild cramps, similar to menstrual cramps.  Tightening or pressure in your abdomen.  You think that your baby is not moving as much as usual, or you cannot feel your baby move.  You have chest pain.  You have shortness of breath. This information is not intended to replace advice given to you by your health care provider. Make sure you discuss any questions you have with your health care provider. Document Released: 07/07/2013 Document Revised: 05/15/2016 Document Reviewed: 04/19/2016 Elsevier Interactive Patient Education  2017 Elsevier Inc.    NO IBUPROFEN PRODUCTS UNTIL AFTER 5:15 pm TODAY.

## 2017-08-01 NOTE — Op Note (Signed)
Operative Note    Preoperative Diagnosis: IUP at [redacted]weeks gestation                                             Hx incompetent cervix   Postoperative Diagnosis: Same   Procedure: Cervical cerclage; prophylactic   Surgeon: Mindi SlickerBanga, C DO  Anesthesia: Spinal  Fluids: LR  800ml EBL: 5ml UOP: 350ml  Findings: Cervical length approx 4cm; closed before and after surgery; Viable infant seen before and after surgery   Specimen: none   Procedure Note Pt taken to OR and spinal anesthesia administered without complications. Pt placed in dorsal lithotomy position and appropriate time out done. Pt was prepped with betadine, foley catheter placed for bladder decompression and pt draped in sterile fashion. Exam under anesthesia confirmed cervix still closed. Speculum placed and local anesthesia with 1% lidocaine with epinephrine injected in a circumferential pattern at 12, 3, 6 and 9 o'clock. 0 mersilene suture placed circumferentially with traction provided using a ring forceps. Knot placed at 12 o'clock. A second suture placed with knot at 3 oclock. Essentially no bleeding noted at this time. All instruments then removed from pts vagina. Counts correct. Viable fetus noted on bedside US prior to and immediately after procedure. Pt to recovery room in stable condition. Tolerated procedure well

## 2017-08-01 NOTE — Interval H&P Note (Signed)
History and Physical Interval Note:  08/01/2017 9:05 AM  [name]  has presented today for surgery, with the diagnosis of cerclage   The various methods of treatment have been discussed with the patient and family. After consideration of risks, benefits and other options for treatment, the patient has consented to  Procedure(s): CERCLAGE CERVICAL (N/A) as a surgical intervention .  The patient's history has been reviewed, patient examined, no change in status, stable for surgery.  I have reviewed the patient's chart and labs.  Questions were answered to the patient's satisfaction.     Cathrine Musterecilia W Aarthi Uyeno

## 2017-08-01 NOTE — Anesthesia Procedure Notes (Signed)
Spinal  Patient location during procedure: OR Staffing Anesthesiologist: Cristela BlueJACKSON, Minh Roanhorse Spinal Block Patient position: sitting Prep: DuraPrep Patient monitoring: heart rate, blood pressure and continuous pulse ox Approach: right paramedian Location: L3-4 Injection technique: single-shot Needle Needle type: Sprotte  Needle gauge: 24 G Needle length: 9 cm Needle insertion depth: 6 cm Assessment Sensory level: T4 Additional Notes Spinal Dosage in OR  .75% Bupivicaine ml       1.1 sprotte thru [place]

## 2017-08-03 ENCOUNTER — Encounter (HOSPITAL_COMMUNITY): Payer: Self-pay | Admitting: Obstetrics and Gynecology

## 2017-08-03 NOTE — Anesthesia Postprocedure Evaluation (Signed)
Anesthesia Post Note  Patient: Miranda Arroyo  Procedure(s) Performed: CERCLAGE CERVICAL (N/A )     Patient location during evaluation: PACU Anesthesia Type: Spinal Level of consciousness: awake Pain management: satisfactory to patient Vital Signs Assessment: post-procedure vital signs reviewed and stable Respiratory status: spontaneous breathing Cardiovascular status: blood pressure returned to baseline Postop Assessment: no headache and spinal receding Anesthetic complications: no    Last Vitals:  Vitals:   08/01/17 1300 08/01/17 1332  BP: 140/80 (!) 148/90  Pulse: 89 87  Resp: 20 20  Temp: 37 C   SpO2: 100% 100%    Last Pain:  Vitals:   08/01/17 1200  TempSrc:   PainSc: Asleep   Pain Goal: Patients Stated Pain Goal: 5 (08/01/17 0751)               Cristela BlueJACKSON,Zaxton Angerer EDWARD

## 2018-01-05 LAB — OB RESULTS CONSOLE GBS: GBS: NEGATIVE

## 2018-01-14 ENCOUNTER — Telehealth (HOSPITAL_COMMUNITY): Payer: Self-pay | Admitting: *Deleted

## 2018-01-14 ENCOUNTER — Encounter (HOSPITAL_COMMUNITY): Payer: Self-pay | Admitting: *Deleted

## 2018-01-14 NOTE — Telephone Encounter (Signed)
Preadmission screen  

## 2018-01-26 ENCOUNTER — Other Ambulatory Visit: Payer: Self-pay

## 2018-01-26 ENCOUNTER — Inpatient Hospital Stay (HOSPITAL_COMMUNITY): Payer: 59 | Admitting: Anesthesiology

## 2018-01-26 ENCOUNTER — Inpatient Hospital Stay (HOSPITAL_COMMUNITY)
Admission: AD | Admit: 2018-01-26 | Discharge: 2018-01-29 | DRG: 788 | Disposition: A | Discharge status: Home / Self Care | Payer: 59 | Source: Ambulatory Visit | Attending: Obstetrics and Gynecology | Admitting: Obstetrics and Gynecology

## 2018-01-26 ENCOUNTER — Encounter (HOSPITAL_COMMUNITY): Payer: Self-pay

## 2018-01-26 DIAGNOSIS — O24420 Gestational diabetes mellitus in childbirth, diet controlled: Secondary | ICD-10-CM | POA: Diagnosis present

## 2018-01-26 DIAGNOSIS — O99844 Bariatric surgery status complicating childbirth: Secondary | ICD-10-CM | POA: Diagnosis present

## 2018-01-26 DIAGNOSIS — O34211 Maternal care for low transverse scar from previous cesarean delivery: Secondary | ICD-10-CM | POA: Diagnosis present

## 2018-01-26 DIAGNOSIS — Z3A37 37 weeks gestation of pregnancy: Secondary | ICD-10-CM | POA: Diagnosis not present

## 2018-01-26 DIAGNOSIS — O368330 Maternal care for abnormalities of the fetal heart rate or rhythm, third trimester, not applicable or unspecified: Secondary | ICD-10-CM | POA: Diagnosis not present

## 2018-01-26 DIAGNOSIS — Z98891 History of uterine scar from previous surgery: Secondary | ICD-10-CM

## 2018-01-26 DIAGNOSIS — K432 Incisional hernia without obstruction or gangrene: Secondary | ICD-10-CM | POA: Diagnosis present

## 2018-01-26 DIAGNOSIS — O99214 Obesity complicating childbirth: Secondary | ICD-10-CM | POA: Diagnosis present

## 2018-01-26 DIAGNOSIS — O10913 Unspecified pre-existing hypertension complicating pregnancy, third trimester: Secondary | ICD-10-CM | POA: Diagnosis not present

## 2018-01-26 DIAGNOSIS — O1002 Pre-existing essential hypertension complicating childbirth: Principal | ICD-10-CM | POA: Diagnosis present

## 2018-01-26 DIAGNOSIS — O09299 Supervision of pregnancy with other poor reproductive or obstetric history, unspecified trimester: Secondary | ICD-10-CM

## 2018-01-26 DIAGNOSIS — O368390 Maternal care for abnormalities of the fetal heart rate or rhythm, unspecified trimester, not applicable or unspecified: Secondary | ICD-10-CM

## 2018-01-26 DIAGNOSIS — O10919 Unspecified pre-existing hypertension complicating pregnancy, unspecified trimester: Secondary | ICD-10-CM

## 2018-01-26 LAB — URINALYSIS, ROUTINE W REFLEX MICROSCOPIC
BILIRUBIN URINE: NEGATIVE
Glucose, UA: NEGATIVE mg/dL
HGB URINE DIPSTICK: NEGATIVE
Ketones, ur: NEGATIVE mg/dL
NITRITE: NEGATIVE
PH: 6 (ref 5.0–8.0)
Protein, ur: NEGATIVE mg/dL
Specific Gravity, Urine: 1.009 (ref 1.005–1.030)

## 2018-01-26 LAB — CBC
HEMATOCRIT: 33.4 % — AB (ref 36.0–46.0)
Hemoglobin: 11.5 g/dL — ABNORMAL LOW (ref 12.0–15.0)
MCH: 29 pg (ref 26.0–34.0)
MCHC: 34.4 g/dL (ref 30.0–36.0)
MCV: 84.1 fL (ref 78.0–100.0)
PLATELETS: 319 10*3/uL (ref 150–400)
RBC: 3.97 MIL/uL (ref 3.87–5.11)
RDW: 14.2 % (ref 11.5–15.5)
WBC: 8.7 10*3/uL (ref 4.0–10.5)

## 2018-01-26 LAB — COMPREHENSIVE METABOLIC PANEL
ALT: 14 U/L (ref 14–54)
AST: 19 U/L (ref 15–41)
Albumin: 3 g/dL — ABNORMAL LOW (ref 3.5–5.0)
Alkaline Phosphatase: 103 U/L (ref 38–126)
Anion gap: 9 (ref 5–15)
BILIRUBIN TOTAL: 0.2 mg/dL — AB (ref 0.3–1.2)
BUN: 8 mg/dL (ref 6–20)
CHLORIDE: 105 mmol/L (ref 101–111)
CO2: 22 mmol/L (ref 22–32)
CREATININE: 0.69 mg/dL (ref 0.44–1.00)
Calcium: 8.8 mg/dL — ABNORMAL LOW (ref 8.9–10.3)
Glucose, Bld: 85 mg/dL (ref 65–99)
Potassium: 3.6 mmol/L (ref 3.5–5.1)
Sodium: 136 mmol/L (ref 135–145)
TOTAL PROTEIN: 6.7 g/dL (ref 6.5–8.1)

## 2018-01-26 LAB — PROTEIN / CREATININE RATIO, URINE
Creatinine, Urine: 94 mg/dL
Protein Creatinine Ratio: 0.26 mg/mg{Cre} — ABNORMAL HIGH (ref 0.00–0.15)
TOTAL PROTEIN, URINE: 24 mg/dL

## 2018-01-26 LAB — TYPE AND SCREEN
ABO/RH(D): B POS
ANTIBODY SCREEN: NEGATIVE

## 2018-01-26 MED ORDER — SOD CITRATE-CITRIC ACID 500-334 MG/5ML PO SOLN
30.0000 mL | ORAL | Status: DC | PRN
  Administered 2018-01-27: 30 mL via ORAL
  Filled 2018-01-26: qty 15

## 2018-01-26 MED ORDER — ACETAMINOPHEN 325 MG PO TABS: 650.0000 mg | ORAL_TABLET | ORAL | Status: DC | PRN

## 2018-01-26 MED ORDER — EPHEDRINE 5 MG/ML INJ: 10.0000 mg | INTRAVENOUS | Status: DC | PRN

## 2018-01-26 MED ORDER — DIPHENHYDRAMINE HCL 50 MG/ML IJ SOLN: 12.5000 mg | INTRAMUSCULAR | Status: DC | PRN

## 2018-01-26 MED ORDER — PHENYLEPHRINE 40 MCG/ML (10ML) SYRINGE FOR IV PUSH (FOR BLOOD PRESSURE SUPPORT)
80.0000 ug | PREFILLED_SYRINGE | INTRAVENOUS | Status: DC | PRN
  Filled 2018-01-26: qty 10

## 2018-01-26 MED ORDER — LACTATED RINGERS IV SOLN
INTRAVENOUS | Status: DC
  Administered 2018-01-26 – 2018-01-27 (×2): via INTRAUTERINE

## 2018-01-26 MED ORDER — HYDRALAZINE HCL 20 MG/ML IJ SOLN
10.0000 mg | INTRAMUSCULAR | Status: DC | PRN
  Administered 2018-01-26 (×2): 10 mg via INTRAVENOUS
  Filled 2018-01-26: qty 1

## 2018-01-26 MED ORDER — LACTATED RINGERS IV SOLN: 500.0000 mL | Freq: Once | INTRAVENOUS | Status: DC

## 2018-01-26 MED ORDER — PHENYLEPHRINE 40 MCG/ML (10ML) SYRINGE FOR IV PUSH (FOR BLOOD PRESSURE SUPPORT): 80.0000 ug | PREFILLED_SYRINGE | INTRAVENOUS | Status: DC | PRN

## 2018-01-26 MED ORDER — PHENYLEPHRINE 40 MCG/ML (10ML) SYRINGE FOR IV PUSH (FOR BLOOD PRESSURE SUPPORT)
80.0000 ug | PREFILLED_SYRINGE | INTRAVENOUS | Status: DC | PRN
Start: 2018-01-26 — End: 2018-01-26

## 2018-01-26 MED ORDER — ONDANSETRON HCL 4 MG/2ML IJ SOLN: 4.0000 mg | Freq: Four times a day (QID) | INTRAMUSCULAR | Status: DC | PRN

## 2018-01-26 MED ORDER — OXYTOCIN 40 UNITS IN LACTATED RINGERS INFUSION - SIMPLE MED: 2.5000 [IU]/h | INTRAVENOUS | Status: DC

## 2018-01-26 MED ORDER — FENTANYL 2.5 MCG/ML BUPIVACAINE 1/10 % EPIDURAL INFUSION (WH - ANES)
14.0000 mL/h | INTRAMUSCULAR | Status: DC | PRN
  Administered 2018-01-26: 12 mL/h via EPIDURAL
  Filled 2018-01-26: qty 100

## 2018-01-26 MED ORDER — TERBUTALINE SULFATE 1 MG/ML IJ SOLN
0.2500 mg | Freq: Once | INTRAMUSCULAR | Status: AC | PRN
Start: 2018-01-26 — End: 2018-01-27
  Administered 2018-01-27: 0.25 mg via SUBCUTANEOUS
  Filled 2018-01-26: qty 1

## 2018-01-26 MED ORDER — OXYCODONE-ACETAMINOPHEN 5-325 MG PO TABS: 1.0000 | ORAL_TABLET | ORAL | Status: DC | PRN

## 2018-01-26 MED ORDER — OXYCODONE-ACETAMINOPHEN 5-325 MG PO TABS: 2.0000 | ORAL_TABLET | ORAL | Status: DC | PRN

## 2018-01-26 MED ORDER — OXYTOCIN 40 UNITS IN LACTATED RINGERS INFUSION - SIMPLE MED
1.0000 m[IU]/min | INTRAVENOUS | Status: DC
  Administered 2018-01-26: 2 m[IU]/min via INTRAVENOUS
  Filled 2018-01-26: qty 1000

## 2018-01-26 MED ORDER — LACTATED RINGERS IV SOLN
500.0000 mL | INTRAVENOUS | Status: DC | PRN
  Administered 2018-01-26: 300 mL via INTRAVENOUS
  Administered 2018-01-26: 500 mL via INTRAVENOUS

## 2018-01-26 MED ORDER — HYDRALAZINE HCL 20 MG/ML IJ SOLN
10.0000 mg | Freq: Once | INTRAMUSCULAR | Status: AC | PRN
  Administered 2018-01-26: 10 mg via INTRAVENOUS
  Filled 2018-01-26: qty 1

## 2018-01-26 MED ORDER — LACTATED RINGERS IV SOLN
INTRAVENOUS | Status: DC
  Administered 2018-01-26 (×3): via INTRAVENOUS

## 2018-01-26 MED ORDER — LIDOCAINE HCL (PF) 1 % IJ SOLN: 30.0000 mL | INTRAMUSCULAR | Status: DC | PRN

## 2018-01-26 MED ORDER — LACTATED RINGERS IV SOLN
500.0000 mL | Freq: Once | INTRAVENOUS | Status: AC
  Administered 2018-01-26: 500 mL via INTRAVENOUS

## 2018-01-26 MED ORDER — LABETALOL HCL 200 MG PO TABS
300.0000 mg | ORAL_TABLET | Freq: Three times a day (TID) | ORAL | Status: DC
  Administered 2018-01-26: 300 mg via ORAL
  Filled 2018-01-26: qty 1

## 2018-01-26 MED ORDER — LABETALOL HCL 5 MG/ML IV SOLN
20.0000 mg | INTRAVENOUS | Status: AC | PRN
  Administered 2018-01-26: 40 mg via INTRAVENOUS
  Administered 2018-01-26: 80 mg via INTRAVENOUS
  Administered 2018-01-26: 20 mg via INTRAVENOUS
  Filled 2018-01-26: qty 4
  Filled 2018-01-26: qty 16
  Filled 2018-01-26 (×2): qty 4

## 2018-01-26 MED ORDER — LIDOCAINE HCL (PF) 1 % IJ SOLN
INTRAMUSCULAR | Status: DC | PRN
  Administered 2018-01-26 (×2): 5 mL via EPIDURAL

## 2018-01-26 MED ORDER — OXYTOCIN BOLUS FROM INFUSION: 500.0000 mL | Freq: Once | INTRAVENOUS | Status: DC

## 2018-01-26 NOTE — MAU Note (Signed)
Pt presents to MAU from OB office for elevated blood pressures. Here for [facility] workup.

## 2018-01-26 NOTE — MAU Note (Addendum)
After consecutive severe range blood pressures, spoke with provider Mayford Knife, CNM), was told to continue holding on the labetolol protocol due to pt taking home medications.

## 2018-01-26 NOTE — H&P (Signed)
Miranda Arroyo is a 37 y.o. female [id number] at 37 weeks (EDD 02/16/18 by 7 week [place]) presenting to MAU with elevated BP beyond her baseline CHTN. Pt seen in office today with BP noted to be 160-170/90-100.  She was sent to MAU for additional evaluation and had normal labs with BP 135-164/76-105.  She is on labetalol  po TID and Procardia XL  po BID.  She reports a headache over weekend but none currently.  While being monitored in MAU she had a category 1 tracing but then a decel to 70 for a minute that resolved with repositioning.  Being admitted for delivery. Prenatal care complicated by several issues: 1) Incompetent cervix--h/o 16 and 22 week fetal losses--s/p first trimester cerclage with removal at 36 weeks 2) Gestational DM--diet controlled 3) h/o c-section--LTCS 2006 for fetal indication, successful VBAC x 1 2009 4) AMA--panorama low risk 5) Hypertension-on HCTZ at intake and changed to labetalol.  Requiring increasing doses of medications and addition of procardia.  Baseline 24 hour urine  6) h/o PTD at 33 and 34 weeks even with cerclage so on 17-P this pregnancy 7) Obesity-BMI 46, s/p gastric sleeve surgery 2015   OB History    Gravida  6   Para  3   Term      Preterm  3   AB  2   Living  2     SAB  1   TAB  1   Ectopic      Multiple      Live Births  3         2002 16 week loss following SROM 2003 EAB 2005 22 week loss following SROM 2006 cerclage, LTCS 34 weeks fetal decels 4#8oz 2009 VBAC 33 weeks, cerclage 3#2oz induced    Past Medical History:  Diagnosis Date  . Gestational diabetes   . H/O gestational diabetes mellitus, not currently pregnant    with pregnancy only  . Hypertension   . Incompetent cervix   . Morbid obesity with BMI of 45.0-49.9, adult (HCC)   . PONV (postoperative nausea and vomiting)    with gastric sleeve surgery only - no other problems with other surgery  . SVD (spontaneous vaginal delivery) 2005,2009   x 2 -  2005  svd at 24 wks fetal demise   Past Surgical History:  Procedure Laterality Date  . CERVICAL CERCLAGE  12-08-13   '06,'09  . CERVICAL CERCLAGE N/A 08/01/2017   Procedure: CERCLAGE CERVICAL;  Surgeon: Edwinna Areola, DO;  Location: [facility];  Service: Gynecology;  Laterality: N/A;  . CESAREAN SECTION  2006  . DILATION AND CURETTAGE OF UTERUS    . LAPAROSCOPIC GASTRIC SLEEVE RESECTION N/A 12/13/2013   Procedure: LAPAROSCOPIC GASTRIC SLEEVE RESECTION, upper endoscopy;  Surgeon: Mariella Saa, MD;  Location: [facility];  Service: General;  Laterality: N/A;  . WISDOM TOOTH EXTRACTION     Family History: family history includes Alcohol abuse in her father and mother; Depression in her mother; Hypertension in her mother; Thyroid disease in her mother. Social History:  reports that she has never smoked. She has never used smokeless tobacco. She reports that she does not drink alcohol or use drugs.     Maternal Diabetes: Yes:  Diabetes Type:  Diet controlled Genetic Screening: Normal Maternal Ultrasounds/Referrals: Normal Fetal Ultrasounds or other Referrals:  None Maternal Substance Abuse:  No Significant Maternal Medications:  Meds include: Other: labetalol and procardia Significant Maternal Lab Results:  None Other Comments:  None  Review of Systems  Eyes: Negative for blurred vision.  Gastrointestinal: Negative for abdominal pain.  Neurological: Negative for headaches.   Maternal Medical History:  Contractions: Frequency: rare.   Perceived severity is mild.    Fetal activity: Perceived fetal activity is normal.    Prenatal complications: PIH.   Prenatal Complications - Diabetes: gestational. Diabetes is managed by diet.        Blood pressure (!) 159/98, pulse 78, temperature 98.4 F (36.9 C), temperature source Oral, resp. rate 18, height  (1.575 m), weight 256 lb (116.1 kg), last menstrual period 05/16/2017, SpO2 100 %. Maternal Exam:  Uterine Assessment:  Contraction strength is mild.  Contraction frequency is rare.   Abdomen: Patient reports no abdominal tenderness. Surgical scars: low transverse.   Fetal presentation: vertex  Introitus: Normal vulva. Normal vagina.  Pelvis: adequate for delivery.      Physical Exam  Constitutional:  obese  Cardiovascular: Normal rate and regular rhythm.  GI: Soft.  Genitourinary: Vagina normal.  Musculoskeletal: She exhibits edema.  Neurological: She is alert.  Psychiatric: She has a normal mood and affect.   Cervix 50/2-3/-2 AROM clear and FSE applied  Prenatal labs: ABO, Rh: --/--/B POS (11/02 0745) Antibody: NEG (11/02 0745) Rubella: Immune (10/23 0000) RPR: Nonreactive (10/23 0000)  HBsAg: Negative (10/23 0000)  HIV: Non-reactive (10/23 0000)  GBS: Negative (04/08 0000)  Hgb AA Essential panel negative One hour GCT 153 Three hour GTT abnormal  Assessment/Plan: Pt admitted with exacerbation of CHTN and also some intermittent spontaneous FHR decelerations.  BS good at 85 and has been diet controlled.   BP has required IV labetalol protocol and hydralazine to control, but currently improved s/p hydralazine to 138/80. D/w pt may need to start magnesium if rebound back to harder to control.  Labs WNL overall except mildly elevated prot:creat ratio of .26 And no s/s/ of preeclampsia Cervix favorable AROM performed and FSE placed to follow closely.  We discussed risks and benefits of VBAC again and she is aware if FHR concerning may need repeat c-section for fetal indication.  Will start pitocin and follow closely.  Hopefully since favorable and prior successful VBAC, will progress quickly Encouraged epidural and she is planning  [name] 01/26/2018, 3:24 PM

## 2018-01-26 NOTE — Progress Notes (Signed)
Patient ID: Miranda Arroyo, female   DOB: 27-Mar-1981, 37 y.o.   MRN: [id number] Pt became uncomfortable and received epidural then had some deep variable decels so pitocin turned off. I examined her at 2115 and placed an IUPC and cervix noted to be 3-4cm/70/-2 I then left to do another delivery and tracing improved with variables resolving so pitocin resumed.   FHR category 1 with occasional mild variables until position change attempted from pt right side to back, then deep variables resumed down to 70-80.   Amnioinfusion begun and various positions and O2 employed with pitocin d/c again.  Variables resolved back on patient's right side only.  Good variability and only mild variables noted. Pt feeling some left discomfort, mostly from the unilateral positioning and anesthesia aware.    Will give time to progress and recheck in an hour as long as FHR acceptable and contractions remain adequate with pitocin off.  Counseled pt that if deep variables resume we may have to proceed with repeat c-section and she is agreeable. Pitocin already off and other interventions employed.  Aware of process and risks/benefits  Received labetalol  po this PM and repeat hydralazine dose.  BP 123/69-164/83

## 2018-01-26 NOTE — MAU Note (Signed)
Spoke with provider about 2 severe range blood pressures 15 min apart. Advised to hold on the labetalol protocol due to pt taking PO daily dose of home labetalol at 1240.

## 2018-01-26 NOTE — Anesthesia Pain Management Evaluation Note (Signed)
  CRNA Pain Management Visit Note  Patient: Miranda Arroyo, 37 y.o., female  "Hello I am a member of the anesthesia team at [facility]. We have an anesthesia team available at all times to provide care throughout the hospital, including epidural management and anesthesia for C-section. I don't know your plan for the delivery whether it a natural birth, water birth, IV sedation, nitrous supplementation, doula or epidural, but we want to meet your pain goals."   1.Was your pain managed to your expectations on prior hospitalizations?   Yes   2.What is your expectation for pain management during this hospitalization?     Epidural  3.How can we help you reach that goal? epidural  Record the patient's initial score and the patient's pain goal.   Pain: 1  Pain Goal: 4 [facility] wants you to be able to say your pain was always managed very well.  Madison Hickman 01/26/2018

## 2018-01-26 NOTE — Anesthesia Procedure Notes (Signed)
Epidural Patient location during procedure: OB Start time: 01/26/2018 8:00 PM End time: 01/26/2018 8:24 PM  Staffing Anesthesiologist: Jairo Ben, MD Performed: anesthesiologist   Preanesthetic Checklist Completed: patient identified, surgical consent, pre-op evaluation, timeout performed, IV checked, risks and benefits discussed and monitors and equipment checked  Epidural Patient position: sitting Prep: site prepped and draped and DuraPrep Patient monitoring: blood pressure, continuous pulse ox and heart rate Approach: midline Location: L3-L4 Injection technique: LOR air  Needle:  Needle type: Tuohy  Needle gauge: 17 G Needle length: 9 cm Needle insertion depth: 5.5 cm Catheter type: closed end flexible Catheter size: 19 Gauge Catheter at skin depth: 12 cm Test dose: negative (1% lidocaine)  Assessment Events: blood not aspirated, injection not painful, no injection resistance, negative IV test and no paresthesia  Additional Notes Pt identified in Labor room.  Monitors applied. Working IV access confirmed. Sterile prep, drape lumbar spine.  1% lido local L 3,4.  #17ga Touhy LOR air at 5.5 cm L 3,4, cath in easily to 12 cm skin. Test dose OK, cath dosed and infusion begun.  Patient asymptomatic, VSS, no heme aspirated, tolerated well.  Sandford Craze, MDReason for block:procedure for pain

## 2018-01-26 NOTE — Anesthesia Preprocedure Evaluation (Addendum)
Anesthesia Evaluation  Patient identified by MRN, date of birth, ID band Patient awake    Reviewed: Allergy & Precautions, NPO status , Patient's Chart, lab work & pertinent test results  History of Anesthesia Complications (+) PONV  Airway Mallampati: IV  TM Distance: >3 FB Neck ROM: Full    Dental  (+) Dental Advisory Given   Pulmonary neg pulmonary ROS,    breath sounds clear to auscultation       Cardiovascular hypertension, Pt. on medications (-) angina Rhythm:Regular Rate:Normal     Neuro/Psych negative neurological ROS     GI/Hepatic Neg liver ROS, S/p gastric sleeve surgery   Endo/Other  diabetes (glu 85), GestationalMorbid obesity  Renal/GU negative Renal ROS     Musculoskeletal   Abdominal (+) + obese,   Peds  Hematology Hb 11.5, plt 319k   Anesthesia Other Findings   Reproductive/Obstetrics (+) Pregnancy                            Anesthesia Physical Anesthesia Plan  ASA: III  Anesthesia Plan: Epidural   Post-op Pain Management:    Induction:   PONV Risk Score and Plan: Treatment may vary due to age or medical condition  Airway Management Planned: Natural Airway  Additional Equipment:   Intra-op Plan:   Post-operative Plan:   Informed Consent: I have reviewed the patients History and Physical, chart, labs and discussed the procedure including the risks, benefits and alternatives for the proposed anesthesia with the patient or authorized representative who has indicated his/her understanding and acceptance.   Dental advisory given  Plan Discussed with:   Anesthesia Plan Comments: (Patient identified. Risks/Benefits/Options discussed with patient including but not limited to bleeding, infection, nerve damage, paralysis, failed block, incomplete pain control, headache, blood pressure changes, nausea, vomiting, reactions to medication both or allergic, itching and  postpartum back pain. Confirmed with bedside nurse the patient's most recent platelet count. Confirmed with patient that they are not currently taking any anticoagulation, have any bleeding history or any family history of bleeding disorders. Patient expressed understanding and wished to proceed. All questions were answered. )       Anesthesia Quick Evaluation

## 2018-01-26 NOTE — MAU Note (Signed)
CRNA at bedside starting I.V., ok to give 20 mg of labetalol before transfer to L&D per Mayford Knife, CNM.

## 2018-01-26 NOTE — MAU Note (Signed)
Multiple RN's at bedside attempting to start I.V., Anesthesia called.

## 2018-01-26 NOTE — MAU Provider Note (Addendum)
Chief Complaint:  Hypertension   First Provider Initiated Contact with Patient 01/26/18 1205     HPI: Miranda Arroyo is a 37 y.o. [id number] at 32w0dwho presents to maternity admissions reporting Chronic hypertension with severe range pressures.  Sent here for Preeclampsia labs. . She reports good fetal movement, denies LOF, vaginal bleeding, vaginal itching/burning, urinary symptoms, h/a, dizziness, n/v, diarrhea, constipation or fever/chills.  She denies headache, visual changes or RUQ abdominal pain.  Hypertension  This is a recurrent problem. The problem has been gradually worsening since onset. Associated symptoms include peripheral edema. Pertinent negatives include no anxiety, blurred vision, chest pain, headaches or shortness of breath. There are no associated agents to hypertension. Treatments tried: Procardia XL and Labetalol. The current treatment provides no improvement.    RN Note: Pt presents to MAU from OB office for elevated blood pressures. Here for [facility] workup    Past Medical History: Past Medical History:  Diagnosis Date  . Gestational diabetes   . H/O gestational diabetes mellitus, not currently pregnant    with pregnancy only  . Hypertension   . Incompetent cervix   . Morbid obesity with BMI of 45.0-49.9, adult (HCC)   . PONV (postoperative nausea and vomiting)    with gastric sleeve surgery only - no other problems with other surgery  . SVD (spontaneous vaginal delivery) 2005,2009   x 2 -  2005 svd at 24 wks fetal demise    Past obstetric history: OB History  Gravida Para Term Preterm AB Living  SAB TAB Ectopic Multiple Live Births  # Outcome Date GA Lbr Len/2nd Weight Sex Delivery Anes PTL Lv  6 Current           5 Preterm 10/05/07 [redacted]w[redacted]d   F Vag-Spont  Y LIV  4 Preterm 08/19/05 [redacted]w[redacted]d   F CS-LTranv  Y LIV     Complications: Fetal Intolerance  3 Preterm 04/03/04 [redacted]w[redacted]d   F Vag-Spont   ND  2 TAB 02/01/02          1 SAB 09/03/01             Past Surgical History: Past Surgical History:  Procedure Laterality Date  . CERVICAL CERCLAGE  12-08-13   '06,'09  . CERVICAL CERCLAGE N/A 08/01/2017   Procedure: CERCLAGE CERVICAL;  Surgeon: Edwinna Areola, DO;  Location: [facility];  Service: Gynecology;  Laterality: N/A;  . CESAREAN SECTION  2006  . DILATION AND CURETTAGE OF UTERUS    . LAPAROSCOPIC GASTRIC SLEEVE RESECTION N/A 12/13/2013   Procedure: LAPAROSCOPIC GASTRIC SLEEVE RESECTION, upper endoscopy;  Surgeon: Mariella Saa, MD;  Location: [facility];  Service: General;  Laterality: N/A;  . WISDOM TOOTH EXTRACTION      Family History: Family History  Problem Relation Age of Onset  . Alcohol abuse Father   . Thyroid disease Mother   . Hypertension Mother   . Alcohol abuse Mother   . Depression Mother     Social History: Social History   Tobacco Use  . Smoking status: Never Smoker  . Smokeless tobacco: Never Used  Substance Use Topics  . Alcohol use: No  . Drug use: No    Allergies: No Known Allergies  Meds:  Medications Prior to Admission  Medication Sig Dispense Refill Last Dose  . acetaminophen (TYLENOL) 500 MG tablet Take 1 tablet (500 mg total) by mouth every 6 (six) hours  as needed for mild pain or moderate pain. 40 tablet 1   . Acetaminophen-Codeine (TYLENOL/CODEINE #3) 300-30 MG tablet Take 1 tablet by mouth every 4 (four) hours as needed for pain. 20 tablet 0   . aspirin EC 81 MG tablet Take 81 mg by mouth daily.   Past Week at Unknown time  . labetalol (NORMODYNE) 100 MG tablet Take 100 mg by mouth 2 (two) times daily.   08/01/2017 at 0500  . Prenatal Vit-Fe Fumarate-FA (PRENATAL/FOLIC ACID) TABS Take 1 tablet by mouth daily.   07/31/2017 at Unknown time    I have reviewed patient's Past Medical Hx, Surgical Hx, Family Hx, Social Hx, medications and allergies.   ROS:  Review of Systems  Eyes: Negative for blurred vision.  Respiratory: Negative for shortness of breath.   Cardiovascular:  Negative for chest pain.  Neurological: Negative for headaches.   Other systems negative  Physical Exam   Vitals:   01/26/18 1346 01/26/18 1400 01/26/18 1416 01/26/18 1431  BP: (!) 161/104 (!) 163/102 (!) 164/105 (!) 159/98  Pulse: 72 74 82 78  Resp:      Temp:      TempSrc:      SpO2: 98% 96% 100% 100%  Weight:      Height:        Constitutional: Well-developed, well-nourished female in no acute distress.  Cardiovascular: normal rate and rhythm Respiratory: normal effort, clear to auscultation bilaterally GI: Abd soft, non-tender, gravid appropriate for gestational age.   No rebound or guarding. MS: Extremities nontender, Trace to 1+ pedal edema, normal ROM Neurologic: Alert and oriented x 4.  GU: Neg CVAT.  PELVIC EXAM:  deferred  FHT:  Baseline 135 , moderate variability, accelerations present, One spontaneous variable decel to 70-80s x 2 min with return to baseline Contractions:  Irregular, mild   Labs: --/--/B POS (11/02 0745) Results for orders placed or performed during the hospital encounter of 01/26/18 (from the past 24 hour(s))  Urinalysis, Routine w reflex microscopic     Status: Abnormal   Collection Time: 01/26/18 11:05 AM  Result Value Ref Range   Color, Urine YELLOW YELLOW   APPearance CLEAR CLEAR   Specific Gravity, Urine 1.009 1.005 - 1.030   pH 6.0 5.0 - 8.0   Glucose, UA NEGATIVE NEGATIVE mg/dL   Hgb urine dipstick NEGATIVE NEGATIVE   Bilirubin Urine NEGATIVE NEGATIVE   Ketones, ur NEGATIVE NEGATIVE mg/dL   Protein, ur NEGATIVE NEGATIVE mg/dL   Nitrite NEGATIVE NEGATIVE   Leukocytes, UA TRACE (A) NEGATIVE   RBC / HPF 0-5 0 - 5 RBC/hpf   WBC, UA 0-5 0 - 5 WBC/hpf   Bacteria, UA MANY (A) NONE SEEN   Squamous Epithelial / LPF 0-5 0 - 5  Protein / creatinine ratio, urine     Status: Abnormal   Collection Time: 01/26/18 11:45 AM  Result Value Ref Range   Creatinine, Urine 94.00 mg/dL   Total Protein, Urine 24 mg/dL   Protein Creatinine Ratio  0.26 (H) 0.00 - 0.15 mg/mg[Cre]  CBC     Status: Abnormal   Collection Time: 01/26/18 12:25 PM  Result Value Ref Range   WBC 8.7 4.0 - 10.5 K/uL   RBC 3.97 3.87 - 5.11 MIL/uL   Hemoglobin 11.5 (L) 12.0 - 15.0 g/dL   HCT 53.6 (L) 64.4 - 03.4 %   MCV 84.1 78.0 - 100.0 fL   MCH 29.0 26.0 - 34.0 pg   MCHC 34.4 30.0 - 36.0 g/dL  RDW 14.2 11.5 - 15.5 %   Platelets 319 150 - 400 K/uL  Comprehensive metabolic panel     Status: Abnormal   Collection Time: 01/26/18 12:25 PM  Result Value Ref Range   Sodium 136 135 - 145 mmol/L   Potassium 3.6 3.5 - 5.1 mmol/L   Chloride 105 101 - 111 mmol/L   CO2 22 22 - 32 mmol/L   Glucose, Bld 85 65 - 99 mg/dL   BUN 8 6 - 20 mg/dL   Creatinine, Ser 1.61 0.44 - 1.00 mg/dL   Calcium 8.8 (L) 8.9 - 10.3 mg/dL   Total Protein 6.7 6.5 - 8.1 g/dL   Albumin 3.0 (L) 3.5 - 5.0 g/dL   AST 19 15 - 41 U/L   ALT 14 14 - 54 U/L   Alkaline Phosphatase 103 38 - 126 U/L   Total Bilirubin 0.2 (L) 0.3 - 1.2 mg/dL   GFR calc non Af Amer >60 >60 mL/min   GFR calc Af Amer >60 >60 mL/min   Anion gap 9 5 - 15    Imaging:  No results found.  MAU Course/MDM: I have ordered labs and reviewed results. Preeclampsia labs WNL NST reviewed, reactive with one deep decel x 2 min Consult Dr Senaida Ores with presentation, exam findings and test results.   Patient had wanted to wait for Wednesday for IOL to be with Dr Mindi Slicker, but given severe range BPs and prolonged deceleration, Dr Senaida Ores recommends IOL today  While preparing to transfer, had another longer deceleration to 90 x 4 min  Assessment: Single IUP at [redacted]w[redacted]d Chronic hypertension with exacerbation of BPs Variable fetal heart rate deceleration  Plan: Admit to [facility] for IOL Dr Senaida Ores to follow.  Wynelle Bourgeois CNM, MSN Certified Nurse-Midwife 01/26/2018 12:05 PM

## 2018-01-27 ENCOUNTER — Other Ambulatory Visit: Payer: Self-pay

## 2018-01-27 ENCOUNTER — Encounter (HOSPITAL_COMMUNITY)
Admission: AD | Disposition: A | Discharge status: Home / Self Care | Payer: Self-pay | Source: Ambulatory Visit | Attending: Obstetrics and Gynecology

## 2018-01-27 ENCOUNTER — Encounter (HOSPITAL_COMMUNITY): Payer: Self-pay | Admitting: *Deleted

## 2018-01-27 DIAGNOSIS — Z98891 History of uterine scar from previous surgery: Secondary | ICD-10-CM

## 2018-01-27 LAB — CBC
HCT: 32.8 % — ABNORMAL LOW (ref 36.0–46.0)
Hemoglobin: 11.3 g/dL — ABNORMAL LOW (ref 12.0–15.0)
MCH: 28.6 pg (ref 26.0–34.0)
MCHC: 34.5 g/dL (ref 30.0–36.0)
MCV: 83 fL (ref 78.0–100.0)
PLATELETS: 320 10*3/uL (ref 150–400)
RBC: 3.95 MIL/uL (ref 3.87–5.11)
RDW: 14.4 % (ref 11.5–15.5)
WBC: 14.9 10*3/uL — AB (ref 4.0–10.5)

## 2018-01-27 LAB — RPR: RPR Ser Ql: NONREACTIVE

## 2018-01-27 LAB — GLUCOSE, CAPILLARY: GLUCOSE-CAPILLARY: 126 mg/dL — AB (ref 65–99)

## 2018-01-27 SURGERY — Surgical Case
Anesthesia: Epidural

## 2018-01-27 MED ORDER — MORPHINE SULFATE (PF) 4 MG/ML IV SOLN
INTRAVENOUS | Status: AC
  Filled 2018-01-27: qty 1

## 2018-01-27 MED ORDER — SIMETHICONE 80 MG PO CHEW
80.0000 mg | CHEWABLE_TABLET | Freq: Three times a day (TID) | ORAL | Status: DC
  Administered 2018-01-27 – 2018-01-29 (×9): 80 mg via ORAL
  Filled 2018-01-27 (×6): qty 1

## 2018-01-27 MED ORDER — SODIUM BICARBONATE 8.4 % IV SOLN
INTRAVENOUS | Status: DC | PRN
  Administered 2018-01-26: 5 mL via EPIDURAL
  Administered 2018-01-27: 3 mL via EPIDURAL
  Administered 2018-01-27: 4 mL via EPIDURAL
  Administered 2018-01-27: 5 mL via EPIDURAL
  Administered 2018-01-27: 3 mL via EPIDURAL

## 2018-01-27 MED ORDER — OXYCODONE HCL 5 MG PO TABS: 10.0000 mg | ORAL_TABLET | ORAL | Status: DC | PRN

## 2018-01-27 MED ORDER — ONDANSETRON HCL 4 MG/2ML IJ SOLN
INTRAMUSCULAR | Status: DC | PRN
  Administered 2018-01-27: 4 mg via INTRAVENOUS

## 2018-01-27 MED ORDER — SIMETHICONE 80 MG PO CHEW: 80.0000 mg | CHEWABLE_TABLET | ORAL | Status: DC

## 2018-01-27 MED ORDER — SIMETHICONE 80 MG PO CHEW: 80.0000 mg | CHEWABLE_TABLET | ORAL | Status: DC | PRN

## 2018-01-27 MED ORDER — LABETALOL HCL 200 MG PO TABS: 300.0000 mg | ORAL_TABLET | Freq: Three times a day (TID) | ORAL | Status: DC

## 2018-01-27 MED ORDER — PROPOFOL 10 MG/ML IV BOLUS
INTRAVENOUS | Status: AC
  Filled 2018-01-27: qty 20

## 2018-01-27 MED ORDER — SODIUM CHLORIDE 0.9 % IR SOLN
Status: DC | PRN
  Administered 2018-01-27: 1

## 2018-01-27 MED ORDER — LIDOCAINE HCL (CARDIAC) PF 100 MG/5ML IV SOSY
PREFILLED_SYRINGE | INTRAVENOUS | Status: DC | PRN
  Administered 2018-01-27: 25 mg via INTRAVENOUS

## 2018-01-27 MED ORDER — OXYTOCIN 10 UNIT/ML IJ SOLN
INTRAMUSCULAR | Status: AC
  Filled 2018-01-27: qty 4

## 2018-01-27 MED ORDER — PRENATAL MULTIVITAMIN CH: 1.0000 | ORAL_TABLET | Freq: Every day | ORAL | Status: DC

## 2018-01-27 MED ORDER — PROPOFOL 10 MG/ML IV BOLUS
INTRAVENOUS | Status: DC | PRN
  Administered 2018-01-27 (×6): 10 mg via INTRAVENOUS

## 2018-01-27 MED ORDER — ONDANSETRON HCL 4 MG/2ML IJ SOLN
INTRAMUSCULAR | Status: AC
Start: 2018-01-27 — End: ?
  Filled 2018-01-27: qty 2

## 2018-01-27 MED ORDER — LABETALOL HCL 5 MG/ML IV SOLN: 20.0000 mg | INTRAVENOUS | Status: DC | PRN

## 2018-01-27 MED ORDER — MENTHOL 3 MG MT LOZG: 1.0000 | LOZENGE | OROMUCOSAL | Status: DC | PRN

## 2018-01-27 MED ORDER — TETANUS-DIPHTH-ACELL PERTUSSIS 5-2.5-18.5 LF-MCG/0.5 IM SUSP: 0.5000 mL | Freq: Once | INTRAMUSCULAR | Status: DC

## 2018-01-27 MED ORDER — LABETALOL HCL 200 MG PO TABS
300.0000 mg | ORAL_TABLET | Freq: Two times a day (BID) | ORAL | Status: DC
  Administered 2018-01-27 – 2018-01-29 (×5): 300 mg via ORAL
  Filled 2018-01-27 (×5): qty 1

## 2018-01-27 MED ORDER — DIPHENHYDRAMINE HCL 25 MG PO CAPS
25.0000 mg | ORAL_CAPSULE | Freq: Four times a day (QID) | ORAL | Status: DC | PRN
  Administered 2018-01-27: 25 mg via ORAL
  Filled 2018-01-27: qty 1

## 2018-01-27 MED ORDER — ZOLPIDEM TARTRATE 5 MG PO TABS: 5.0000 mg | ORAL_TABLET | Freq: Every evening | ORAL | Status: DC | PRN

## 2018-01-27 MED ORDER — IBUPROFEN 600 MG PO TABS: 600.0000 mg | ORAL_TABLET | Freq: Four times a day (QID) | ORAL | Status: DC

## 2018-01-27 MED ORDER — DIBUCAINE 1 % RE OINT: 1.0000 "application " | TOPICAL_OINTMENT | RECTAL | Status: DC | PRN

## 2018-01-27 MED ORDER — KETOROLAC TROMETHAMINE 30 MG/ML IJ SOLN
INTRAMUSCULAR | Status: AC
  Filled 2018-01-27: qty 1

## 2018-01-27 MED ORDER — KETOROLAC TROMETHAMINE 30 MG/ML IJ SOLN: 30.0000 mg | Freq: Once | INTRAMUSCULAR | Status: DC

## 2018-01-27 MED ORDER — ENOXAPARIN SODIUM 40 MG/0.4ML ~~LOC~~ SOLN: 40.0000 mg | SUBCUTANEOUS | Status: DC

## 2018-01-27 MED ORDER — MORPHINE SULFATE (PF) 4 MG/ML IV SOLN
INTRAVENOUS | Status: AC
  Administered 2018-01-27: 1 mg via INTRAVENOUS
  Filled 2018-01-27: qty 1

## 2018-01-27 MED ORDER — MIDAZOLAM HCL 2 MG/2ML IJ SOLN: 0.5000 mg | Freq: Once | INTRAMUSCULAR | Status: DC | PRN

## 2018-01-27 MED ORDER — HYDRALAZINE HCL 20 MG/ML IJ SOLN
10.0000 mg | Freq: Once | INTRAMUSCULAR | Status: AC | PRN
  Administered 2018-01-27: 10 mg via INTRAVENOUS

## 2018-01-27 MED ORDER — CEFAZOLIN SODIUM-DEXTROSE 2-4 GM/100ML-% IV SOLN
2.0000 g | Freq: Once | INTRAVENOUS | Status: AC
  Administered 2018-01-27: 2 g via INTRAVENOUS

## 2018-01-27 MED ORDER — DEXAMETHASONE SODIUM PHOSPHATE 10 MG/ML IJ SOLN
INTRAMUSCULAR | Status: AC
  Filled 2018-01-27: qty 1

## 2018-01-27 MED ORDER — SENNOSIDES-DOCUSATE SODIUM 8.6-50 MG PO TABS: 2.0000 | ORAL_TABLET | ORAL | Status: DC

## 2018-01-27 MED ORDER — COCONUT OIL OIL: 1.0000 "application " | TOPICAL_OIL | Status: DC | PRN

## 2018-01-27 MED ORDER — PROMETHAZINE HCL 25 MG/ML IJ SOLN: 6.2500 mg | INTRAMUSCULAR | Status: DC | PRN

## 2018-01-27 MED ORDER — OXYTOCIN 40 UNITS IN LACTATED RINGERS INFUSION - SIMPLE MED: 2.5000 [IU]/h | INTRAVENOUS | Status: AC

## 2018-01-27 MED ORDER — MORPHINE SULFATE (PF) 4 MG/ML IV SOLN
1.0000 mg | INTRAVENOUS | Status: DC | PRN
  Administered 2018-01-27 (×2): 1 mg via INTRAVENOUS

## 2018-01-27 MED ORDER — SENNOSIDES-DOCUSATE SODIUM 8.6-50 MG PO TABS
2.0000 | ORAL_TABLET | ORAL | Status: DC
  Administered 2018-01-27 – 2018-01-28 (×2): 2 via ORAL
  Filled 2018-01-27 (×2): qty 2

## 2018-01-27 MED ORDER — ONDANSETRON HCL 4 MG/2ML IJ SOLN
INTRAMUSCULAR | Status: AC
  Filled 2018-01-27: qty 2

## 2018-01-27 MED ORDER — OXYCODONE HCL 5 MG PO TABS: 5.0000 mg | ORAL_TABLET | ORAL | Status: DC | PRN

## 2018-01-27 MED ORDER — LABETALOL HCL 5 MG/ML IV SOLN
INTRAVENOUS | Status: AC
  Filled 2018-01-27: qty 4

## 2018-01-27 MED ORDER — SIMETHICONE 80 MG PO CHEW
80.0000 mg | CHEWABLE_TABLET | Freq: Three times a day (TID) | ORAL | Status: DC
  Filled 2018-01-27 (×3): qty 1

## 2018-01-27 MED ORDER — HYDRALAZINE HCL 20 MG/ML IJ SOLN
INTRAMUSCULAR | Status: AC
  Filled 2018-01-27: qty 1

## 2018-01-27 MED ORDER — MORPHINE SULFATE (PF) 0.5 MG/ML IJ SOLN
INTRAMUSCULAR | Status: DC | PRN
  Administered 2018-01-27: 3 mg via EPIDURAL
  Administered 2018-01-27: .5 mg via INTRAVENOUS

## 2018-01-27 MED ORDER — ACETAMINOPHEN 325 MG PO TABS: 650.0000 mg | ORAL_TABLET | ORAL | Status: DC | PRN

## 2018-01-27 MED ORDER — WITCH HAZEL-GLYCERIN EX PADS: 1.0000 "application " | MEDICATED_PAD | CUTANEOUS | Status: DC | PRN

## 2018-01-27 MED ORDER — DEXAMETHASONE SODIUM PHOSPHATE 4 MG/ML IJ SOLN
INTRAMUSCULAR | Status: AC
  Filled 2018-01-27: qty 1

## 2018-01-27 MED ORDER — IBUPROFEN 600 MG PO TABS
600.0000 mg | ORAL_TABLET | Freq: Four times a day (QID) | ORAL | Status: DC
  Administered 2018-01-27 – 2018-01-29 (×10): 600 mg via ORAL
  Filled 2018-01-27 (×11): qty 1

## 2018-01-27 MED ORDER — NALBUPHINE HCL 10 MG/ML IJ SOLN: 5.0000 mg | INTRAMUSCULAR | Status: DC | PRN

## 2018-01-27 MED ORDER — NIFEDIPINE ER OSMOTIC RELEASE 30 MG PO TB24
60.0000 mg | ORAL_TABLET | Freq: Every day | ORAL | Status: DC
  Administered 2018-01-27 – 2018-01-29 (×3): 60 mg via ORAL
  Filled 2018-01-27 (×3): qty 2

## 2018-01-27 MED ORDER — HYDRALAZINE HCL 20 MG/ML IJ SOLN: 10.0000 mg | Freq: Once | INTRAMUSCULAR | Status: DC | PRN

## 2018-01-27 MED ORDER — DEXTROSE 5 % IV SOLN
INTRAVENOUS | Status: AC
  Filled 2018-01-27: qty 3000

## 2018-01-27 MED ORDER — MAGNESIUM SULFATE 40 G IN LACTATED RINGERS - SIMPLE
2.0000 g/h | INTRAVENOUS | Status: AC
  Administered 2018-01-27 (×2): 2 g/h via INTRAVENOUS
  Filled 2018-01-27 (×2): qty 40

## 2018-01-27 MED ORDER — MAGNESIUM SULFATE BOLUS VIA INFUSION
4.0000 g | Freq: Once | INTRAVENOUS | Status: AC
  Administered 2018-01-27: 4 g via INTRAVENOUS
  Filled 2018-01-27: qty 500

## 2018-01-27 MED ORDER — PRENATAL MULTIVITAMIN CH
1.0000 | ORAL_TABLET | Freq: Every day | ORAL | Status: DC
  Administered 2018-01-27 – 2018-01-29 (×3): 1 via ORAL
  Filled 2018-01-27 (×3): qty 1

## 2018-01-27 MED ORDER — SIMETHICONE 80 MG PO CHEW
80.0000 mg | CHEWABLE_TABLET | ORAL | Status: DC | PRN
  Filled 2018-01-27: qty 1

## 2018-01-27 MED ORDER — MEPERIDINE HCL 25 MG/ML IJ SOLN: 6.2500 mg | INTRAMUSCULAR | Status: DC | PRN

## 2018-01-27 MED ORDER — OXYTOCIN 40 UNITS IN LACTATED RINGERS INFUSION - SIMPLE MED: 2.5000 [IU]/h | INTRAVENOUS | Status: DC

## 2018-01-27 MED ORDER — MORPHINE SULFATE (PF) 0.5 MG/ML IJ SOLN
INTRAMUSCULAR | Status: AC
  Filled 2018-01-27: qty 10

## 2018-01-27 MED ORDER — LABETALOL HCL 5 MG/ML IV SOLN
INTRAVENOUS | Status: DC | PRN
  Administered 2018-01-27: 5 mg via INTRAVENOUS
  Administered 2018-01-27: 10 mg via INTRAVENOUS
  Administered 2018-01-27: 5 mg via INTRAVENOUS

## 2018-01-27 MED ORDER — DEXAMETHASONE SODIUM PHOSPHATE 4 MG/ML IJ SOLN
INTRAMUSCULAR | Status: DC | PRN
  Administered 2018-01-27: 4 mg via INTRAVENOUS

## 2018-01-27 MED ORDER — LACTATED RINGERS IV SOLN
INTRAVENOUS | Status: DC
  Administered 2018-01-27 (×2): via INTRAVENOUS

## 2018-01-27 MED ORDER — DIPHENHYDRAMINE HCL 25 MG PO CAPS: 25.0000 mg | ORAL_CAPSULE | Freq: Four times a day (QID) | ORAL | Status: DC | PRN

## 2018-01-27 MED ORDER — SCOPOLAMINE 1 MG/3DAYS TD PT72
MEDICATED_PATCH | TRANSDERMAL | Status: DC | PRN
  Administered 2018-01-27: 1 via TRANSDERMAL

## 2018-01-27 MED ORDER — LACTATED RINGERS IV SOLN: INTRAVENOUS | Status: DC

## 2018-01-27 MED ORDER — LIDOCAINE HCL (CARDIAC) PF 100 MG/5ML IV SOSY
PREFILLED_SYRINGE | INTRAVENOUS | Status: AC
  Filled 2018-01-27: qty 5

## 2018-01-27 MED ORDER — ENOXAPARIN SODIUM 60 MG/0.6ML ~~LOC~~ SOLN
0.5000 mg/kg | SUBCUTANEOUS | Status: DC
  Administered 2018-01-28: 60 mg via SUBCUTANEOUS
  Filled 2018-01-27 (×3): qty 0.6

## 2018-01-27 SURGICAL SUPPLY — 42 items
APL SKNCLS STERI-STRIP NONHPOA (GAUZE/BANDAGES/DRESSINGS) ×1
BENZOIN TINCTURE PRP APPL 2/3 (GAUZE/BANDAGES/DRESSINGS) ×2 IMPLANT
CHLORAPREP W/TINT 26ML (MISCELLANEOUS) ×3 IMPLANT
CLAMP CORD UMBIL (MISCELLANEOUS) IMPLANT
CLOSURE STERI STRIP 1/2 X4 (GAUZE/BANDAGES/DRESSINGS) ×2 IMPLANT
CLOTH BEACON ORANGE TIMEOUT ST (SAFETY) ×3 IMPLANT
DRSG OPSITE POSTOP 4X10 (GAUZE/BANDAGES/DRESSINGS) ×3 IMPLANT
ELECT REM PT RETURN 9FT ADLT (ELECTROSURGICAL) ×3
ELECTRODE REM PT RTRN 9FT ADLT (ELECTROSURGICAL) ×1 IMPLANT
EXTRACTOR VACUUM KIWI (MISCELLANEOUS) IMPLANT
FORMULA NB 0-3 ENFAMIL (FORMULA) ×2 IMPLANT
GLOVE BIO SURGEON STRL SZ 6.5 (GLOVE) ×2 IMPLANT
GLOVE BIO SURGEONS STRL SZ 6.5 (GLOVE) ×1
GLOVE BIOGEL PI IND STRL 7.0 (GLOVE) ×1 IMPLANT
GLOVE BIOGEL PI INDICATOR 7.0 (GLOVE) ×2
GOWN STRL REUS W/TWL LRG LVL3 (GOWN DISPOSABLE) ×6 IMPLANT
IV SET ADMIN PUMP GEMINI W/NLD (IV SETS) ×2 IMPLANT
KIT ABG SYR 3ML LUER SLIP (SYRINGE) IMPLANT
NDL HYPO 25X5/8 SAFETYGLIDE (NEEDLE) IMPLANT
NEEDLE HYPO 25X5/8 SAFETYGLIDE (NEEDLE) IMPLANT
NS IRRIG 1000ML POUR BTL (IV SOLUTION) ×3 IMPLANT
PACK C SECTION WH (CUSTOM PROCEDURE TRAY) ×3 IMPLANT
PAD OB MATERNITY 4.3X12.25 (PERSONAL CARE ITEMS) ×3 IMPLANT
PENCIL SMOKE EVAC W/HOLSTER (ELECTROSURGICAL) ×3 IMPLANT
RTRCTR C-SECT PINK 25CM LRG (MISCELLANEOUS) ×3 IMPLANT
STRIP CLOSURE SKIN 1/2X4 (GAUZE/BANDAGES/DRESSINGS) ×1 IMPLANT
SUT CHROMIC 1 CTX 36 (SUTURE) ×8 IMPLANT
SUT PLAIN 0 NONE (SUTURE) IMPLANT
SUT PLAIN 2 0 XLH (SUTURE) ×3 IMPLANT
SUT VIC AB 0 CT1 27 (SUTURE) ×6
SUT VIC AB 0 CT1 27XBRD ANBCTR (SUTURE) ×2 IMPLANT
SUT VIC AB 2-0 CT1 27 (SUTURE) ×3
SUT VIC AB 2-0 CT1 TAPERPNT 27 (SUTURE) ×1 IMPLANT
SUT VIC AB 3-0 CT1 27 (SUTURE)
SUT VIC AB 3-0 CT1 TAPERPNT 27 (SUTURE) IMPLANT
SUT VIC AB 3-0 SH 27 (SUTURE) ×3
SUT VIC AB 3-0 SH 27X BRD (SUTURE) IMPLANT
SUT VIC AB 4-0 KS 27 (SUTURE) ×3 IMPLANT
SYR 50ML LL SCALE MARK (SYRINGE) ×2 IMPLANT
TOWEL OR 17X24 6PK STRL BLUE (TOWEL DISPOSABLE) ×3 IMPLANT
TRAY FOLEY W/BAG SLVR 14FR LF (SET/KITS/TRAYS/PACK) ×3 IMPLANT
WATER STERILE IRR 1000ML UROMA (IV SOLUTION) ×2 IMPLANT

## 2018-01-27 NOTE — Progress Notes (Signed)
POD #0 Doing ok so far, pain ok Afeb, VSS except BP labile Abd- soft, fundus firm, incision intact Continue magnesium today, changed BP meds to Labetalol 300 mg bid, added procardia XL 60 mg po daily, will monitor BP and adjust accordingly

## 2018-01-27 NOTE — Lactation Note (Signed)
This note was copied from a baby's chart. Lactation Consultation Note  Patient Name: Miranda Arroyo [id number] Date: 01/27/2018   P3, Older children 19 & 37 years old. Mother did not breastfeed her first 2 children. Reviewed hand expression. Provided mother with NICU booklet, colostrum containers and labels. Mother pumping with DEBP for the first time. Recommend mother post pump at least 8 times per day for 10-20 min with DEBP on initiation setting. Reviewed cleaning and milk storage.       Maternal Data    Feeding Feeding Type: Formula Nipple Type: Slow - flow Length of feed: 10 min  LATCH Score                   Interventions    Lactation Tools Discussed/Used     Consult Status      Hardie Pulley 01/27/2018, 3:03 PM

## 2018-01-27 NOTE — Anesthesia Postprocedure Evaluation (Signed)
Anesthesia Post Note  Patient: Miranda Arroyo  Procedure(s) Performed: CESAREAN SECTION (N/A )     Patient location during evaluation: Women's Unit Anesthesia Type: Epidural Level of consciousness: awake Pain management: pain level controlled Vital Signs Assessment: post-procedure vital signs reviewed and stable Respiratory status: spontaneous breathing Cardiovascular status: stable Postop Assessment: no headache, epidural receding and patient able to bend at knees Anesthetic complications: no    Last Vitals:  Vitals:   01/27/18 0757 01/27/18 0808  BP: (!) 150/89   Pulse: 79   Resp: 18 18  Temp:    SpO2: 95%     Last Pain:  Vitals:   01/27/18 0808  TempSrc:   PainSc: 0-No pain   Pain Goal:                 Edison Pace

## 2018-01-27 NOTE — Transfer of Care (Signed)
Immediate Anesthesia Transfer of Care Note  Patient: Miranda Arroyo  Procedure(s) Performed: CESAREAN SECTION (N/A )  Patient Location: PACU  Anesthesia Type:Epidural  Level of Consciousness: awake  Airway & Oxygen Therapy: Patient Spontanous Breathing  Post-op Assessment: Report given to RN  Post vital signs: Reviewed  Last Vitals:  Vitals Value Taken Time  BP 152/107 01/27/2018  2:21 AM  Temp    Pulse 91 01/27/2018  2:23 AM  Resp 21 01/27/2018  2:23 AM  SpO2 100 % 01/27/2018  2:23 AM  Vitals shown include unvalidated device data.  Last Pain:  Vitals:   01/26/18 2210  TempSrc: Oral  PainSc:          Complications: No apparent anesthesia complications

## 2018-01-27 NOTE — Progress Notes (Signed)
BP stable on meds Adequate UOP Will d/c magnesium at 0300

## 2018-01-27 NOTE — Addendum Note (Signed)
Addendum  created 01/27/18 0829 by Earmon Phoenix, CRNA   Sign clinical note

## 2018-01-27 NOTE — Anesthesia Postprocedure Evaluation (Signed)
Anesthesia Post Note  Patient: Miranda Arroyo  Procedure(s) Performed: CESAREAN SECTION (N/A )     Patient location during evaluation: PACU Anesthesia Type: Epidural Level of consciousness: awake and alert, oriented and patient cooperative Pain management: pain level controlled Vital Signs Assessment: post-procedure vital signs reviewed and stable Respiratory status: spontaneous breathing, nonlabored ventilation and respiratory function stable Cardiovascular status: blood pressure returned to baseline and stable Postop Assessment: epidural receding, patient able to bend at knees and no apparent nausea or vomiting Anesthetic complications: no    Last Vitals:  Vitals:   01/27/18 0245 01/27/18 0300  BP: (!) 172/106 (!) 184/104  Pulse: 77 72  Resp: 18 18  Temp:  37.2 C  SpO2: 100% 100%    Last Pain:  Vitals:   01/27/18 0300  TempSrc: Oral  PainSc: 3    Pain Goal:                 Wendel Homeyer,E. Ellia Knowlton

## 2018-01-27 NOTE — Progress Notes (Signed)
Patient ID: Miranda Arroyo, female   DOB: 01-12-1981, 37 y.o.   MRN: [id number] FHR decelerating to 70- to 80 again. Advised time to proceed with c-section.  Already reviewed risks and benefits and dosed for c-section.  Will proceed back when OR ready.  Terb given to slow contractions.

## 2018-01-27 NOTE — Op Note (Signed)
Operative Note    Preoperative Diagnosis Term pregnancy at 37 1/7 weeks CHTN with exacerabation Gestational Diabetes Prior low transverse c-section  Category 2 FHR remote from delivery  Postoperative Diagnosis Same with IUGR  Procedure Repeat Low transverse C-section Repair of midline incisional fascial hernia  Surgeon Huel Cote, MD  Anesthesia Epidural Propofol  Fluids: EBL UOP clear IVF  Findings Viable female infant in the vertex presentation.  Appeared to be IUGR.  Apgars 9,9. 3#15oz  Uterus had a narrow LUS.  Fascia with midline 2-3 cm defect with underlying preperitoneal fat herniating through slightly.  Normal ovaries and tubes.  Specimen Placenta to pathology  Procedure Note Patient was taken to the operating room where spinal anesthesia was obtained and found to be adequate by Allis clamp test. She was prepped and draped in the normal sterile fashion in the dorsal supine position with a leftward tilt. An appropriate time out was performed. A Pfannenstiel skin incision was then made through a pre-existing scar with the scalpel and carried through to the underlying layer of fascia by sharp dissection and Bovie cautery.  The fascia was noted to have a defect centrally measuring 3 cm and adipose vs bladder pushing through.   The fascia was carefully divide lateral to this defect and the peritoneal cavity identified and entered bluntly. Both omental adhesions and bladder adhesions were taken down from the anterior wall and fascia to access the cavity.  The incision was extended laterally with blunt dissection.   The inferior aspect of the incision was grasped Coker clamps and dissected off the underlying rectus muscles. In a similar fashion the superior aspect was dissected off the rectus muscles. Rectus muscles were already noted to be  separated in the midline with a diastasis . The peritoneal incision was then extended both superiorly and  inferiorly with careful attention to avoid both bowel and bladder. The Alexis self-retaining wound retractor was then placed within the incision and the lower uterine segment exposed. The bladder flap was developed with Metzenbaum scissors and pushed away from the lower uterine segment. The lower uterine segment was then incised in a transverse fashion and the cavity itself entered bluntly. The incision was extended bluntly. The infant's head was then lifted and delivered from the incision without difficulty. The remainder of the infant delivered and the nose and mouth bulb suctioned with the cord clamped and cut as well. The infant was handed off to the waiting pediatricians. The placenta was then spontaneously expressed from the uterus and the uterus cleared of all clots and debris with moist lap sponge. The uterine incision was then repaired in 2 layers the first layer was a running locked layer of 1-0 chromic and the second an imbricating layer of the same suture. The tubes and ovaries were inspected and the gutters cleared of all clots and debris. The uterine incision was inspected and found to be hemostatic. All instruments and sponges as well as the Alexis retractor were then removed from the abdomen. The rectus muscles and peritoneum were then dissected free but the diastasis too pronounced to enable closure. l. The fascia was then closed with 0 Vicryl in a running fashion. Subcutaneous tissue was reapproximated with 3-0 plain in a running fashion. The skin was closed with a subcuticular stitch of 4-0 Vicryl on a Keith needle and then reinforced with benzoin and Steri-Strips. At the conclusion of the procedure all instruments and sponge counts were correct. Patient was taken to the recovery room in  good condition with her baby taken to the NICU for his small size.

## 2018-01-28 ENCOUNTER — Inpatient Hospital Stay (HOSPITAL_COMMUNITY): Admission: RE | Admit: 2018-01-28 | Payer: 59 | Source: Ambulatory Visit

## 2018-01-28 NOTE — Progress Notes (Signed)
Patient ID: [name], female   DOB: Jun 06, 1981, 37 y.o.   MRN: [id number] Pt doing well. Denies HA, blurry vision, CP or SOB. Ambulating and tolerating diet well. Showered and feels great after MgSO4 stopped. Lochia mild. Pain well controlled. No complaints. Bottlefeeding and baby doing well VSS - 137/73 ABD - dressing c/d/i  EXT - no homans, + edema  14.9>11.3<320  A/P: POD#1 s/p repeat c/s for NRFHT, preE - stable         S/p MgSO4 post c/s; on procardia and labetalol         Routine pp/post op care

## 2018-01-28 NOTE — Lactation Note (Addendum)
This note was copied from a baby's chart. Lactation Consultation Note  Patient Name: Miranda Arroyo [id number] Date: 01/28/2018 Reason for consult: Follow-up assessment;NICU baby;Early term 37-38.6wks;Infant < 6lbs   Follow up with mom of 32 hour old NICU infant. Mom is pumping every 2-3 hours and following with hand expression, mom is not getting colostrum at this time. Mom reports she is comfortable with hand expression.   Enc mom to continue to pump 8-12 x in 24 hours and follow with hand expression. Enc mom to make sure she pumps after visiting or holding the baby also. Mom is planning to go to NICU in a few minutes to visit.   Mom has a Medela PIS for home use. Mom aware to take all pump tubing home with her and can use Pumps/Pumping rooms in the NICU after she goes home. Mom reports she has no questions/concerns at this time.    Maternal Data Has patient been taught Hand Expression?: Yes  Feeding Feeding Type: Formula Length of feed: 0 min  LATCH Score                   Interventions    Lactation Tools Discussed/Used Pump Review: Setup, frequency, and cleaning Initiated by:: Reviewed and encouraged 8-12 x a day followed by hand expression   Consult Status Consult Status: Follow-up Date: 01/29/18 Follow-up type: In-patient    Miranda Arroyo 01/28/2018, 9:01 AM

## 2018-01-28 NOTE — Progress Notes (Signed)
Patient ID: Miranda Arroyo, female   DOB: 12-31-1980, 37 y.o.   MRN: [id number] Pt is doing well with no complaints. Has been down in NICU most of day. She denies any complaints: no HA or blurry vision. Considering discharge to home tomorrow  VS: 147/76 ABD - dressing c/d/i EXT - no homans  A/P: Continue current post op care         Possible discharge to home tomorrow

## 2018-01-29 ENCOUNTER — Encounter (HOSPITAL_COMMUNITY): Payer: Self-pay

## 2018-01-29 MED ORDER — IBUPROFEN 600 MG PO TABS: 600.0000 mg | ORAL_TABLET | Freq: Four times a day (QID) | ORAL | 1 refills | Status: AC

## 2018-01-29 MED ORDER — NIFEDIPINE ER OSMOTIC RELEASE 60 MG PO TB24: 60.0000 mg | ORAL_TABLET | Freq: Every day | ORAL | 2 refills | Status: AC

## 2018-01-29 MED ORDER — OXYCODONE HCL 5 MG PO TABS: 5.0000 mg | ORAL_TABLET | Freq: Four times a day (QID) | ORAL | 0 refills | Status: AC | PRN

## 2018-01-29 MED ORDER — LABETALOL HCL 200 MG PO TABS
300.0000 mg | ORAL_TABLET | Freq: Three times a day (TID) | ORAL | Status: DC
  Administered 2018-01-29: 300 mg via ORAL
  Filled 2018-01-29: qty 1

## 2018-01-29 MED ORDER — LABETALOL HCL 300 MG PO TABS: 300.0000 mg | ORAL_TABLET | Freq: Three times a day (TID) | ORAL | 2 refills | Status: AC

## 2018-01-29 MED ORDER — PRENATAL/FOLIC ACID PO TABS: 1.0000 | ORAL_TABLET | Freq: Every day | ORAL | 3 refills | Status: AC

## 2018-01-29 NOTE — Discharge Summary (Signed)
OB Discharge Summary     Patient Name: Miranda Arroyo DOB: 1980/10/01 MRN: [id number]  Date of admission: 01/26/2018 Delivering MD: Huel Cote   Date of discharge: 01/29/2018  Admitting diagnosis: 37WKS,CHTN Intrauterine pregnancy: [redacted]w[redacted]d     Secondary diagnosis:  Active Problems:   Variable fetal heart rate decelerations, antepartum   Status post repeat low transverse cesarean section   S/P repeat low transverse C-section  Additional problems: N/A     Discharge diagnosis: Term Pregnancy Delivered                                                                                                Post partum procedures:N/A  Augmentation: AROM and Pitocin  Complications: None  Hospital course:  Induction of Labor With Cesarean Section  37 y.o. yo [id number] at [redacted]w[redacted]d was admitted to the hospital 01/26/2018 for induction of labor. Patient had a labor course significant for variables, failed TOLAC. The patient went for cesarean section due to repetitive decelerations, and delivered a Viable infant,01/27/2018  Membrane Rupture Time/Date: 6:42 PM ,01/26/2018   Details of operation can be found in separate operative Note.  Patient had an uncomplicated postpartum course. She is ambulating, tolerating a regular diet, passing flatus, and urinating well.  Patient is discharged home in stable condition on 01/29/18.   Patient's BP monitored closely and discharged to home w close follow-up and labetalol and procardia.                              Physical exam  Vitals:   01/29/18 0525 01/29/18 0820 01/29/18 1248 01/29/18 1608  BP: (!) 154/71 (!) 159/84 (!) 159/80 (!) 147/90  Pulse: 91 88 82 97  Resp: Temp: 98.3 F (36.8 C) 98.1 F (36.7 C) 99.2 F (37.3 C) 99.6 F (37.6 C)  TempSrc: Oral  Oral   SpO2: 100% 99% 100% 100%  Weight:      Height:       General: alert and no distress Lochia: appropriate Uterine Fundus: firm Incision: Healing well with no significant  drainage DVT Evaluation: No evidence of DVT seen on physical exam. Labs: Lab Results  Component Value Date   WBC 14.9 (H) 01/27/2018   HGB 11.3 (L) 01/27/2018   HCT 32.8 (L) 01/27/2018   MCV 83.0 01/27/2018   PLT 320 01/27/2018   CMP Latest Ref Rng & Units 01/26/2018  Glucose 65 - 99 mg/dL 85  BUN 6 - 20 mg/dL 8  Creatinine 7.84 - 6.96 mg/dL 2.95  Sodium 284 - 132 mmol/L 136  Potassium 3.5 - 5.1 mmol/L 3.6  Chloride 101 - 111 mmol/L 105  CO2 22 - 32 mmol/L 22  Calcium 8.9 - 10.3 mg/dL 4.4(W)  Total Protein 6.5 - 8.1 g/dL 6.7  Total Bilirubin 0.3 - 1.2 mg/dL 1.0(U)  Alkaline Phos 38 - 126 U/L 103  AST 15 - 41 U/L 19  ALT 14 - 54 U/L 14    Discharge instruction: per After Visit Summary and "Baby and Me Booklet".  After  visit meds:  Allergies as of 01/29/2018   No Known Allergies     Medication List    STOP taking these medications   ferrous sulfate 325 (65 FE) MG tablet     TAKE these medications   ibuprofen 600 MG tablet Commonly known as:  ADVIL,MOTRIN Take 1 tablet (600 mg total) by mouth every 6 (six) hours. Start taking on:  01/30/2018   labetalol 300 MG tablet Commonly known as:  NORMODYNE Take 1 tablet (300 mg total) by mouth 3 (three) times daily.   NIFEdipine 60 MG 24 hr tablet Commonly known as:  PROCARDIA XL/ADALAT-CC Take 1 tablet (60 mg total) by mouth daily. What changed:    medication strength  how much to take   oxyCODONE 5 MG immediate release tablet Commonly known as:  Oxy IR/ROXICODONE Take 1 tablet (5 mg total) by mouth every 6 (six) hours as needed (pain scale 4-7).   PRENATAL/FOLIC ACID Tabs Take 1 tablet by mouth daily.       Diet: routine diet  Activity: Advance as tolerated. Pelvic rest for 6 weeks.   Outpatient follow ZO:XWRU week, 2 weeks and 6 weeks Follow up Appt:No future appointments. Follow up Visit:No follow-ups on file.  Postpartum contraception: Undecided  Newborn Data: Live born female  Birth Weight: 3 lb 15.5  oz (1800 g) APGAR: 9, 9  Newborn Delivery   Birth date/time:  01/27/2018 00:49:00 Delivery type:  C-Section, Low Transverse Trial of labor:  Yes C-section categorization:  Repeat     Baby Feeding: Breast Disposition:home with mother   01/29/2018 Sherian Rein, MD

## 2018-01-29 NOTE — Lactation Note (Signed)
This note was copied from a baby's chart. Lactation Consultation Note  Mom reports she has just finished pumping but did not obtain any milk. Has done hand expression also. Encouragement given. Pumped 2 times yesterday, Encouraged to pump 8 times/24 hours.  Has Medela pump for home. No questions at present. To call prn  Patient Name: Miranda Arroyo ZOXWR'U Date: 01/29/2018 Reason for consult: Follow-up assessment;Early term 37-38.6wks;NICU baby   Maternal Data Does the patient have breastfeeding experience prior to this delivery?: No  Feeding Feeding Type: Formula  LATCH Score                   Interventions    Lactation Tools Discussed/Used WIC Program: No   Consult Status Consult Status: Complete    Pamelia Hoit 01/29/2018, 7:35 AM

## 2018-01-29 NOTE — Progress Notes (Signed)
Discharge instructions given, questions answered, pt states understanding. Signs and given copy 

## 2018-01-29 NOTE — Progress Notes (Addendum)
Subjective: Postpartum Day 2: Cesarean Delivery Patient reports incisional pain, tolerating PO and no problems voiding.  States tired.  Milk is not in yet.  Baby doing well in NICU.    Objective: Vital signs in last 24 hours: Temp:  [97.9 F (36.6 C)-99.5 F (37.5 C)] 98.1 F (36.7 C) (05/02 0820) Pulse Rate:  [82-91] 88 (05/02 0820) Resp:  [16-20] 18 (05/02 0820) BP: (147-159)/(71-84) 159/84 (05/02 0820) SpO2:  [99 %-100 %] 99 % (05/02 0820)  Physical Exam:  General: alert and no distress Lochia: appropriate Uterine Fundus: firm Incision: healing well DVT Evaluation: No evidence of DVT seen on physical exam.  Recent Labs    01/26/18 1225 01/27/18 0533  HGB 11.5* 11.3*  HCT 33.4* 32.8*    Assessment/Plan: Status post Cesarean section. Doing well postoperatively.  Continue current care.  Monitor BP Taking Labetalol 300 bid and Procardia XL  - will increase labetalol to tid. Also had dietcontrolled GDM will do 2hr GTT PP Pt desires d/c to home this afternoon/evening.  Will monitor BP and decide thru day.    Delbert Darley Bovard-Stuckert 01/29/2018, 9:26 AM

## 2018-01-29 NOTE — Progress Notes (Signed)
Patient ID: Miranda Arroyo, female   DOB: 04/26/1981, 37 y.o.   MRN: [id number]   Pt doing well, desires discharge to home.  BP stable.  Will have come for BP check early in week.  Send w both labetalol and Procardia.

## 2018-02-04 ENCOUNTER — Ambulatory Visit: Payer: Self-pay

## 2018-02-04 NOTE — Lactation Note (Signed)
This note was copied from a baby's chart. Lactation Consultation Note  Patient Name: Boy Carrisa Keller ZOXWR'U Date: 02/04/2018  Pecola Leisure is 63 days old.  Mom concerned because she is only producing drops.  She is pumping 3-4 times per day.  She has not used the symphony pump since discharge.  Mom shown pumping rooms and instructed to use pump while visiting baby.  Skin to skin encouraged as much as possible.  Discussed importance of relaxing while pumping.  Reviewed importance of pumping at least 8 times/24 hours.   Maternal Data    Feeding Feeding Type: Formula Nipple Type: Dr. Irving Burton Preemie Length of feed: 15 min  LATCH Score                   Interventions    Lactation Tools Discussed/Used     Consult Status      Huston Foley 02/04/2018, 8:59 AM

## 2020-06-26 ENCOUNTER — Other Ambulatory Visit: Payer: Self-pay | Admitting: Obstetrics and Gynecology

## 2020-06-26 DIAGNOSIS — N6012 Diffuse cystic mastopathy of left breast: Secondary | ICD-10-CM

## 2020-06-26 DIAGNOSIS — N6011 Diffuse cystic mastopathy of right breast: Secondary | ICD-10-CM

## 2020-06-28 ENCOUNTER — Telehealth: Payer: Self-pay | Admitting: Family Medicine

## 2020-06-28 NOTE — Telephone Encounter (Signed)
Attempted to schedule new patient appointment but per patient appointments are too far out for what she is looking for. Referred patient to other [facility] locations, please advise. CB is [phone]

## 2020-07-12 ENCOUNTER — Ambulatory Visit: Payer: 59

## 2020-07-12 ENCOUNTER — Ambulatory Visit
Admission: RE | Admit: 2020-07-12 | Discharge: 2020-07-12 | Disposition: A | Discharge status: Home / Self Care | Payer: Managed Care, Other (non HMO) | Source: Ambulatory Visit | Attending: Obstetrics and Gynecology | Admitting: Obstetrics and Gynecology

## 2020-07-12 ENCOUNTER — Other Ambulatory Visit: Payer: Self-pay

## 2020-07-12 DIAGNOSIS — N6011 Diffuse cystic mastopathy of right breast: Secondary | ICD-10-CM

## 2020-07-12 DIAGNOSIS — N6012 Diffuse cystic mastopathy of left breast: Secondary | ICD-10-CM

## 2022-08-08 ENCOUNTER — Other Ambulatory Visit: Payer: Self-pay | Admitting: Obstetrics and Gynecology

## 2022-08-08 DIAGNOSIS — R928 Other abnormal and inconclusive findings on diagnostic imaging of breast: Secondary | ICD-10-CM

## 2022-09-03 ENCOUNTER — Ambulatory Visit
Admission: RE | Admit: 2022-09-03 | Discharge: 2022-09-03 | Disposition: A | Discharge status: Home / Self Care | Payer: 59 | Source: Ambulatory Visit | Attending: Obstetrics and Gynecology | Admitting: Obstetrics and Gynecology

## 2022-09-03 ENCOUNTER — Ambulatory Visit: Payer: Managed Care, Other (non HMO)

## 2022-09-03 DIAGNOSIS — R928 Other abnormal and inconclusive findings on diagnostic imaging of breast: Secondary | ICD-10-CM

## 2023-04-02 ENCOUNTER — Ambulatory Visit: Payer: Self-pay

## 2023-08-21 ENCOUNTER — Other Ambulatory Visit: Payer: Self-pay | Admitting: Obstetrics and Gynecology

## 2023-08-21 DIAGNOSIS — R928 Other abnormal and inconclusive findings on diagnostic imaging of breast: Secondary | ICD-10-CM

## 2023-09-02 ENCOUNTER — Ambulatory Visit
Admission: RE | Admit: 2023-09-02 | Discharge: 2023-09-02 | Disposition: A | Discharge status: Home / Self Care | Payer: 59 | Source: Ambulatory Visit | Attending: Obstetrics and Gynecology | Admitting: Obstetrics and Gynecology

## 2023-09-02 ENCOUNTER — Other Ambulatory Visit: Payer: 59

## 2023-09-02 DIAGNOSIS — R928 Other abnormal and inconclusive findings on diagnostic imaging of breast: Secondary | ICD-10-CM
